# Patient Record
Sex: Female | Born: 1935 | Race: White | Hispanic: No | Smoking: Former smoker
Health system: Southern US, Community
[De-identification: ages and names within clinical notes are randomized; demographics above are authoritative.]

## PROBLEM LIST (undated history)

## (undated) DIAGNOSIS — K635 Polyp of colon: Secondary | ICD-10-CM

## (undated) DIAGNOSIS — Z87891 Personal history of nicotine dependence: Secondary | ICD-10-CM

## (undated) DIAGNOSIS — K219 Gastro-esophageal reflux disease without esophagitis: Secondary | ICD-10-CM

## (undated) DIAGNOSIS — J449 Chronic obstructive pulmonary disease, unspecified: Secondary | ICD-10-CM

## (undated) DIAGNOSIS — R0600 Dyspnea, unspecified: Secondary | ICD-10-CM

## (undated) DIAGNOSIS — E785 Hyperlipidemia, unspecified: Secondary | ICD-10-CM

## (undated) DIAGNOSIS — E0500 Thyrotoxicosis with diffuse goiter without thyrotoxic crisis or storm: Secondary | ICD-10-CM

## (undated) DIAGNOSIS — E079 Disorder of thyroid, unspecified: Secondary | ICD-10-CM

## (undated) DIAGNOSIS — R0609 Other forms of dyspnea: Secondary | ICD-10-CM

## (undated) HISTORY — DX: Disorder of thyroid, unspecified: E07.9

## (undated) HISTORY — DX: Gastro-esophageal reflux disease without esophagitis: K21.9

## (undated) HISTORY — DX: Other forms of dyspnea: R06.09

## (undated) HISTORY — DX: Chronic obstructive pulmonary disease, unspecified: J44.9

## (undated) HISTORY — DX: Polyp of colon: K63.5

## (undated) HISTORY — DX: Dyspnea, unspecified: R06.00

## (undated) HISTORY — DX: Hyperlipidemia, unspecified: E78.5

## (undated) HISTORY — DX: Personal history of nicotine dependence: Z87.891

## (undated) HISTORY — DX: Thyrotoxicosis with diffuse goiter without thyrotoxic crisis or storm: E05.00

---
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
## 1958-01-22 HISTORY — PX: CHOLECYSTECTOMY: SHX55

## 1958-01-22 HISTORY — PX: APPENDECTOMY: SHX54

## 1998-03-11 ENCOUNTER — Ambulatory Visit (HOSPITAL_COMMUNITY): Admission: RE | Admit: 1998-03-11 | Discharge: 1998-03-11 | Payer: Self-pay | Admitting: Gastroenterology

## 2000-01-23 HISTORY — PX: CATARACT EXTRACTION: SUR2

## 2000-05-01 ENCOUNTER — Other Ambulatory Visit: Admission: RE | Admit: 2000-05-01 | Discharge: 2000-05-01 | Payer: Self-pay

## 2001-04-16 ENCOUNTER — Encounter: Admission: RE | Admit: 2001-04-16 | Discharge: 2001-04-16 | Payer: Self-pay | Admitting: Obstetrics and Gynecology

## 2001-04-16 ENCOUNTER — Encounter: Payer: Self-pay | Admitting: Obstetrics and Gynecology

## 2001-10-27 ENCOUNTER — Other Ambulatory Visit: Admission: RE | Admit: 2001-10-27 | Discharge: 2001-10-27 | Payer: Self-pay | Admitting: Obstetrics and Gynecology

## 2002-09-09 ENCOUNTER — Encounter: Payer: Self-pay | Admitting: Obstetrics and Gynecology

## 2002-09-09 ENCOUNTER — Encounter: Admission: RE | Admit: 2002-09-09 | Discharge: 2002-09-09 | Payer: Self-pay | Admitting: Obstetrics and Gynecology

## 2002-11-10 ENCOUNTER — Other Ambulatory Visit: Admission: RE | Admit: 2002-11-10 | Discharge: 2002-11-10 | Payer: Self-pay | Admitting: Obstetrics and Gynecology

## 2004-02-01 ENCOUNTER — Encounter: Admission: RE | Admit: 2004-02-01 | Discharge: 2004-02-01 | Payer: Self-pay | Admitting: Obstetrics and Gynecology

## 2004-10-03 ENCOUNTER — Ambulatory Visit: Payer: Self-pay | Admitting: Family Medicine

## 2004-10-10 ENCOUNTER — Encounter: Admission: RE | Admit: 2004-10-10 | Discharge: 2004-10-10 | Payer: Self-pay | Admitting: Family Medicine

## 2004-10-10 ENCOUNTER — Ambulatory Visit: Payer: Self-pay

## 2005-01-30 ENCOUNTER — Encounter: Admission: RE | Admit: 2005-01-30 | Discharge: 2005-01-30 | Payer: Self-pay | Admitting: Family Medicine

## 2005-01-30 ENCOUNTER — Ambulatory Visit: Payer: Self-pay | Admitting: Family Medicine

## 2005-02-02 ENCOUNTER — Ambulatory Visit: Payer: Self-pay | Admitting: Family Medicine

## 2005-02-06 ENCOUNTER — Encounter: Admission: RE | Admit: 2005-02-06 | Discharge: 2005-02-06 | Payer: Self-pay | Admitting: Obstetrics and Gynecology

## 2005-02-06 ENCOUNTER — Other Ambulatory Visit: Admission: RE | Admit: 2005-02-06 | Discharge: 2005-02-06 | Payer: Self-pay | Admitting: Obstetrics and Gynecology

## 2005-03-19 ENCOUNTER — Encounter: Payer: Self-pay | Admitting: Internal Medicine

## 2005-11-06 ENCOUNTER — Ambulatory Visit: Payer: Self-pay | Admitting: Internal Medicine

## 2005-11-20 ENCOUNTER — Ambulatory Visit: Payer: Self-pay

## 2005-12-11 ENCOUNTER — Ambulatory Visit: Payer: Self-pay | Admitting: Internal Medicine

## 2005-12-25 ENCOUNTER — Encounter: Payer: Self-pay | Admitting: Internal Medicine

## 2005-12-25 LAB — CONVERTED CEMR LAB
ALT: 20 units/L (ref 0–40)
AST: 20 units/L (ref 0–37)
Chol/HDL Ratio, serum: 4.6
HDL: 42.6 mg/dL (ref >39.0)
Hgb A1c MFr Bld: 6.1 % — ABNORMAL HIGH (ref 4.6–6.0)
LDL Cholesterol: 123 mg/dL — ABNORMAL HIGH (ref 0–99)
Triglyceride fasting, serum: 148 mg/dL (ref 0–149)
VLDL: 30 mg/dL (ref 0–40)

## 2006-01-08 ENCOUNTER — Ambulatory Visit: Payer: Self-pay | Admitting: Internal Medicine

## 2006-03-19 ENCOUNTER — Ambulatory Visit: Payer: Self-pay | Admitting: Internal Medicine

## 2006-03-22 ENCOUNTER — Ambulatory Visit: Payer: Self-pay | Admitting: Internal Medicine

## 2006-03-22 LAB — CONVERTED CEMR LAB
HDL: 45.3 mg/dL (ref >39.0)
Hgb A1c MFr Bld: 6.1 % — ABNORMAL HIGH (ref 4.6–6.0)
Triglycerides: 79 mg/dL (ref 0–149)
VLDL: 16 mg/dL (ref 0–40)

## 2006-06-25 ENCOUNTER — Ambulatory Visit: Payer: Self-pay | Admitting: Internal Medicine

## 2006-06-25 LAB — CONVERTED CEMR LAB
BUN: 18 mg/dL (ref 6–23)
CO2: 31 meq/L (ref 19–32)
Calcium: 9.4 mg/dL (ref 8.4–10.5)
Creatinine, Ser: 1 mg/dL (ref 0.4–1.2)
GFR calc non Af Amer: 58 mL/min
Glucose, Bld: 97 mg/dL (ref 70–99)
Potassium: 4.1 meq/L (ref 3.5–5.1)
Sodium: 137 meq/L (ref 135–145)
Vit D, 1,25-Dihydroxy: 37 (ref 20–57)

## 2006-12-23 ENCOUNTER — Encounter: Payer: Self-pay | Admitting: Internal Medicine

## 2006-12-23 DIAGNOSIS — E785 Hyperlipidemia, unspecified: Secondary | ICD-10-CM

## 2006-12-23 DIAGNOSIS — E0500 Thyrotoxicosis with diffuse goiter without thyrotoxic crisis or storm: Secondary | ICD-10-CM | POA: Insufficient documentation

## 2006-12-23 DIAGNOSIS — D126 Benign neoplasm of colon, unspecified: Secondary | ICD-10-CM

## 2006-12-23 DIAGNOSIS — E039 Hypothyroidism, unspecified: Secondary | ICD-10-CM | POA: Insufficient documentation

## 2006-12-23 DIAGNOSIS — K219 Gastro-esophageal reflux disease without esophagitis: Secondary | ICD-10-CM

## 2006-12-24 ENCOUNTER — Ambulatory Visit: Payer: Self-pay | Admitting: Internal Medicine

## 2006-12-24 DIAGNOSIS — R7309 Other abnormal glucose: Secondary | ICD-10-CM | POA: Insufficient documentation

## 2006-12-24 DIAGNOSIS — M810 Age-related osteoporosis without current pathological fracture: Secondary | ICD-10-CM | POA: Insufficient documentation

## 2007-01-10 ENCOUNTER — Ambulatory Visit: Payer: Self-pay | Admitting: Internal Medicine

## 2007-01-10 LAB — CONVERTED CEMR LAB
Direct LDL: 121.1 mg/dL
HDL: 48.1 mg/dL (ref >39.0)
Hgb A1c MFr Bld: 6.1 % — ABNORMAL HIGH (ref 4.6–6.0)
TSH: 3.44 microintl units/mL (ref 0.35–5.50)
Triglycerides: 130 mg/dL (ref 0–149)

## 2007-02-25 ENCOUNTER — Ambulatory Visit: Payer: Self-pay | Admitting: Internal Medicine

## 2007-02-25 ENCOUNTER — Encounter: Payer: Self-pay | Admitting: Internal Medicine

## 2007-04-08 ENCOUNTER — Ambulatory Visit: Payer: Self-pay | Admitting: Internal Medicine

## 2007-04-08 DIAGNOSIS — M542 Cervicalgia: Secondary | ICD-10-CM

## 2007-05-13 ENCOUNTER — Ambulatory Visit: Payer: Self-pay | Admitting: Internal Medicine

## 2007-06-03 ENCOUNTER — Encounter: Admission: RE | Admit: 2007-06-03 | Discharge: 2007-06-03 | Payer: Self-pay | Admitting: Obstetrics and Gynecology

## 2007-06-05 LAB — CONVERTED CEMR LAB

## 2007-06-10 ENCOUNTER — Other Ambulatory Visit
Admission: RE | Admit: 2007-06-10 | Discharge: 2007-06-10 | Payer: Self-pay | Admitting: Physical Medicine & Rehabilitation

## 2007-06-24 ENCOUNTER — Ambulatory Visit: Payer: Self-pay | Admitting: Internal Medicine

## 2007-06-24 DIAGNOSIS — M25519 Pain in unspecified shoulder: Secondary | ICD-10-CM

## 2007-10-28 ENCOUNTER — Ambulatory Visit: Payer: Self-pay | Admitting: Internal Medicine

## 2007-12-23 ENCOUNTER — Ambulatory Visit: Payer: Self-pay | Admitting: Internal Medicine

## 2007-12-23 LAB — CONVERTED CEMR LAB
ALT: 21 units/L (ref 0–35)
AST: 23 units/L (ref 0–37)
CO2: 30 meq/L (ref 19–32)
Calcium: 9.5 mg/dL (ref 8.4–10.5)
Chloride: 108 meq/L (ref 96–112)
Cholesterol: 232 mg/dL (ref 0–200)
Creatinine, Ser: 1 mg/dL (ref 0.4–1.2)
Direct LDL: 152.1 mg/dL
GFR calc Af Amer: 70 mL/min
Hgb A1c MFr Bld: 6.1 % — ABNORMAL HIGH (ref 4.6–6.0)
Sodium: 144 meq/L (ref 135–145)
TSH: 10.9 microintl units/mL — ABNORMAL HIGH (ref 0.35–5.50)
Triglycerides: 138 mg/dL (ref 0–149)

## 2007-12-25 ENCOUNTER — Telehealth: Payer: Self-pay | Admitting: Internal Medicine

## 2007-12-25 ENCOUNTER — Encounter: Payer: Self-pay | Admitting: Internal Medicine

## 2008-02-03 ENCOUNTER — Ambulatory Visit: Payer: Self-pay | Admitting: Internal Medicine

## 2008-02-04 ENCOUNTER — Telehealth (INDEPENDENT_AMBULATORY_CARE_PROVIDER_SITE_OTHER): Payer: Self-pay | Admitting: *Deleted

## 2008-09-13 ENCOUNTER — Encounter: Payer: Self-pay | Admitting: Internal Medicine

## 2008-09-15 ENCOUNTER — Encounter: Admission: RE | Admit: 2008-09-15 | Discharge: 2008-09-15 | Payer: Self-pay | Admitting: Gastroenterology

## 2008-09-15 ENCOUNTER — Encounter: Payer: Self-pay | Admitting: Internal Medicine

## 2008-09-15 ENCOUNTER — Encounter: Admission: RE | Admit: 2008-09-15 | Discharge: 2008-09-15 | Payer: Self-pay | Admitting: Obstetrics and Gynecology

## 2008-09-20 ENCOUNTER — Encounter: Payer: Self-pay | Admitting: Internal Medicine

## 2008-09-21 LAB — HM COLONOSCOPY

## 2008-09-23 ENCOUNTER — Encounter: Payer: Self-pay | Admitting: Internal Medicine

## 2008-09-23 ENCOUNTER — Encounter: Admission: RE | Admit: 2008-09-23 | Discharge: 2008-09-23 | Payer: Self-pay | Admitting: Gastroenterology

## 2008-12-01 ENCOUNTER — Ambulatory Visit: Payer: Self-pay | Admitting: Internal Medicine

## 2008-12-30 ENCOUNTER — Ambulatory Visit: Payer: Self-pay | Admitting: Internal Medicine

## 2008-12-30 LAB — CONVERTED CEMR LAB
Albumin: 4.6 g/dL (ref 3.5–5.2)
Cholesterol: 190 mg/dL (ref 0–200)
HDL: 47 mg/dL (ref >39)
Indirect Bilirubin: 0.5 mg/dL (ref 0.0–0.9)
LDL Cholesterol: 117 mg/dL — ABNORMAL HIGH (ref 0–99)
TSH: 0.339 microintl units/mL — ABNORMAL LOW (ref 0.350–4.500)
Total CHOL/HDL Ratio: 4
Total Protein: 7.6 g/dL (ref 6.0–8.3)
Triglycerides: 128 mg/dL (ref <150)

## 2008-12-31 ENCOUNTER — Telehealth: Payer: Self-pay | Admitting: Internal Medicine

## 2009-01-11 ENCOUNTER — Encounter (INDEPENDENT_AMBULATORY_CARE_PROVIDER_SITE_OTHER): Payer: Self-pay | Admitting: *Deleted

## 2009-01-17 ENCOUNTER — Encounter: Payer: Self-pay | Admitting: Internal Medicine

## 2009-01-25 ENCOUNTER — Telehealth: Payer: Self-pay | Admitting: Internal Medicine

## 2009-03-09 ENCOUNTER — Encounter: Payer: Self-pay | Admitting: Internal Medicine

## 2009-03-15 ENCOUNTER — Ambulatory Visit: Payer: Self-pay | Admitting: Internal Medicine

## 2009-03-15 LAB — CONVERTED CEMR LAB
Calcium: 9.4 mg/dL (ref 8.4–10.5)
Phosphorus: 4.2 mg/dL (ref 2.3–4.6)
TSH: 1.427 microintl units/mL (ref 0.350–4.500)

## 2009-03-29 ENCOUNTER — Ambulatory Visit (HOSPITAL_COMMUNITY): Admission: RE | Admit: 2009-03-29 | Discharge: 2009-03-29 | Payer: Self-pay | Admitting: Internal Medicine

## 2009-03-29 ENCOUNTER — Encounter: Payer: Self-pay | Admitting: Internal Medicine

## 2009-04-04 ENCOUNTER — Ambulatory Visit: Payer: Self-pay | Admitting: Internal Medicine

## 2009-04-04 DIAGNOSIS — H9209 Otalgia, unspecified ear: Secondary | ICD-10-CM | POA: Insufficient documentation

## 2009-05-05 ENCOUNTER — Ambulatory Visit: Payer: Self-pay | Admitting: Family Medicine

## 2009-05-05 ENCOUNTER — Encounter: Payer: Self-pay | Admitting: Internal Medicine

## 2009-07-07 ENCOUNTER — Encounter: Payer: Self-pay | Admitting: Internal Medicine

## 2009-07-12 ENCOUNTER — Ambulatory Visit: Payer: Self-pay | Admitting: Internal Medicine

## 2009-08-09 ENCOUNTER — Encounter: Payer: Self-pay | Admitting: Internal Medicine

## 2009-08-30 ENCOUNTER — Encounter: Payer: Self-pay | Admitting: Internal Medicine

## 2009-11-02 ENCOUNTER — Telehealth (INDEPENDENT_AMBULATORY_CARE_PROVIDER_SITE_OTHER): Payer: Self-pay | Admitting: *Deleted

## 2009-11-10 ENCOUNTER — Encounter: Payer: Self-pay | Admitting: Pulmonary Disease

## 2009-11-10 ENCOUNTER — Ambulatory Visit: Payer: Self-pay | Admitting: Pulmonary Disease

## 2009-11-10 DIAGNOSIS — J4489 Other specified chronic obstructive pulmonary disease: Secondary | ICD-10-CM | POA: Insufficient documentation

## 2009-11-10 DIAGNOSIS — J449 Chronic obstructive pulmonary disease, unspecified: Secondary | ICD-10-CM | POA: Insufficient documentation

## 2009-11-15 ENCOUNTER — Telehealth: Payer: Self-pay | Admitting: Pulmonary Disease

## 2009-11-17 ENCOUNTER — Ambulatory Visit: Payer: Self-pay | Admitting: Internal Medicine

## 2009-12-21 ENCOUNTER — Encounter: Payer: Self-pay | Admitting: Internal Medicine

## 2009-12-21 ENCOUNTER — Ambulatory Visit: Payer: Self-pay | Admitting: Diagnostic Radiology

## 2009-12-21 ENCOUNTER — Ambulatory Visit (HOSPITAL_BASED_OUTPATIENT_CLINIC_OR_DEPARTMENT_OTHER)
Admission: RE | Admit: 2009-12-21 | Discharge: 2009-12-21 | Payer: Self-pay | Source: Home / Self Care | Admitting: Internal Medicine

## 2009-12-21 LAB — CONVERTED CEMR LAB
ALT: 16 units/L (ref 0–35)
Alkaline Phosphatase: 64 units/L (ref 39–117)
BUN: 20 mg/dL (ref 6–23)
Chloride: 97 meq/L (ref 96–112)
Creatinine, Ser: 0.89 mg/dL (ref 0.40–1.20)
HDL: 55 mg/dL (ref >39)
Hgb A1c MFr Bld: 5.8 % — ABNORMAL HIGH (ref <5.7)
Indirect Bilirubin: 0.4 mg/dL (ref 0.0–0.9)
Potassium: 4.4 meq/L (ref 3.5–5.3)
Sodium: 141 meq/L (ref 135–145)
TSH: 0.535 microintl units/mL (ref 0.350–4.500)
Total CHOL/HDL Ratio: 3.7
Triglycerides: 107 mg/dL (ref <150)
VLDL: 21 mg/dL (ref 0–40)

## 2009-12-21 LAB — HM MAMMOGRAPHY: HM Mammogram: NORMAL

## 2009-12-29 ENCOUNTER — Ambulatory Visit: Payer: Self-pay | Admitting: Internal Medicine

## 2010-01-03 ENCOUNTER — Ambulatory Visit: Payer: Self-pay | Admitting: Pulmonary Disease

## 2010-02-21 NOTE — Miscellaneous (Signed)
Summary: BONE DENSITY  Clinical Lists Changes  Orders: Added new Test order of T-Bone Densitometry (77080) - Signed Added new Test order of T-Lumbar Vertebral Assessment (77082) - Signed 

## 2010-02-21 NOTE — Assessment & Plan Note (Signed)
Summary: new pt//sob/ok per sn//td   Primary Care Provider:  Dondra Spry DO  CC:  New patient self referred for DOE....  History of Present Illness: 77 y/o WF, who used to work for [employer] in the 90's, self referred for complaints of DOE... she is an ex-smoker, no prev COPD diagnosis, no hx recurrent infections, etc... she has hx Hyperlipidemia, hx Grave's dis- s/p I-131 Rx w/ subseq hypothyroidism on Synthroid, hx GERD, hx colon polyps w/ FamHx of Lynch syndrome, and Osteoporosis (SEE BELOW).   ~  November 10, 2009:  presents w/ CC of DOE x 65yr- noted w/ yard work, housework, etc... gradual onset & not really progressive by her hx but appt request precipitated by episode of SOB several weeks ago while in her car... she notes SOB= hard to get the air out, denies any cough or phlegm, no hemoptysis, no wheezing heard, no CP or palpit, no change in swelling etc... she has not had any recent infection... she is an ex-smoker having started in her 76's & smoked up to 2ppd for 40 yrs, quitting in 2003 at age 29 (she quit then because of SOB)... her last CXR was 9/06 and showed tort Ao, clear lungs, NAD... she has not had prev PFTs.   Current Problems:   CHRONIC OBSTRUCTIVE PULMONARY DISEASE (ICD-496), DYSPNEA ON EXERTION (ICD-786.09), Hx of TOBACCO ABUSE (ICD-305.1)  ** SEE ABOVE ** started on ADVAIR 250- 2spBid & SPIRIVA daily.  ~  ex-smoker> started in her 75's & smoked up to 2ppd for 40 yrs, quitting in 2003 at age 20.  ~  CXR 10/11 showed COPD, tortuous Ao, NAD...  ~  PFTs 10/11 showed FVC= 1.70 (51%), FEV1= 1.10 (44%), %1sec= 65, mid-flows= 32%pred.  HYPERLIPIDEMIA (ICD-272.4) - on LIPITOR 20mg /d...  GRAVE'S DISEASE (ICD-242.00) - treated w/ I-131 in 2004...  HYPOTHYROIDISM (ICD-244.9) - on SYNTHROID 176mcg/d...  GASTROESOPHAGEAL REFLUX DISEASE (ICD-530.81) - on ZANTAC 150mg  Bid Prn for reflux symptoms.  ~  EGD by [facility] 8/10 showed a Schatzki ring, mod HH, otherw neg...  COLONIC POLYPS  (ICD-211.3) - followed by [facility]> she had hx of ischemic colitis in past...  ~  colonoscopy 8/10 showed scattered divertics, 1 diminutive polyp= hyperplastic.  ~  MRI Abd 9/10 showed mild biliary dil- related to prev cholecystectomy, pancreas divisum, otherw neg.  OSTEOPOROSIS (ICD-733.00) - on RECLAST infusions yearly...  R/O GENETIC SUSCEPTIBILITY OTHER MALIGNANT NEOPLASM (ICD-V84.09) - there is a family hx of [facility] Syndrome & she has been evaluated at the [facility]> she was negative for the MLH1 mutation previously identified in members of her family...   Preventive Screening-Counseling & Management  Alcohol-Tobacco     Smoking Status: quit     Year Quit: 2003  Comments: smoked 1-2 ppd  Allergies: 1)  ! Codeine 2)  ! Sulfa 3)  ! Niacin 4)  ! * Bee Sting  Comments:  Nurse/Medical Assistant: The patient's medications and allergies were reviewed with the patient and were updated in the Medication and Allergy Lists.  Past History:  Past Medical History: DYSPNEA ON EXERTION (ICD-786.09) CHRONIC OBSTRUCTIVE PULMONARY DISEASE (ICD-496) Hx of TOBACCO ABUSE (ICD-305.1) HYPERLIPIDEMIA (ICD-272.4) GRAVE'S DISEASE (ICD-242.00) HYPOTHYROIDISM (ICD-244.9) GASTROESOPHAGEAL REFLUX DISEASE (ICD-530.81) COLONIC POLYPS (ICD-211.3) R/O GENETIC SUSCEPTIBILITY OTHER MALIGNANT NEOPLASM (ICD-V84.09) OSTEOPOROSIS (ICD-733.00)  Past Surgical History: Appendectomy 1960 Cholecystectomy 1960 Tonsillectomy 1941 Cataract extraction 2002  Family History: Reviewed history from 07/12/2009 and no changes required. Mother deceased at age 91 secondary to complications of septic shock.  Her mother was noted to have rheumatoid arthritis.  Father deceased at age 76, presumed secondary to old age, but complications of hip fracture.  The patient has a brother with known coronary artery disease status post stents.  His coronary artery disease was first discovered at age 36,  and the patient had multiple siblings with colon cancer.  Social History: Reviewed history from 07/12/2009 and no changes required. 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  She is originally from [place].  Previous to living in [place], she was in [place], [place].   Alcohol use-yes (rare)   Former Smoker   Review of Systems       The patient complains of dyspnea on exertion.  The patient denies anorexia, fever, weight loss, weight gain, vision loss, decreased hearing, hoarseness, chest pain, syncope, peripheral edema, prolonged cough, headaches, hemoptysis, abdominal pain, melena, hematochezia, severe indigestion/heartburn, hematuria, incontinence, muscle weakness, suspicious skin lesions, transient blindness, difficulty walking, depression, unusual weight change, abnormal bleeding, enlarged lymph nodes, and angioedema.   Resp:  Complains of shortness of breath; denies chest discomfort, chest pain with inspiration, cough, coughing up blood, excessive snoring, hypersomnolence, morning headaches, pleuritic, sputum productive, and wheezing.  Vital Signs:  Patient profile:   77 year old female Height:      68 inches Weight:      174.25 pounds BMI:     26.59 O2 Sat:      96 % on Room air Temp:     98.5 degrees F oral Pulse rate:   68 / minute BP sitting:   150 / 80  (left arm) Cuff size:   regular  Vitals Entered By: Randell Loop CMA (November 10, 2009 2:10 PM)  O2 Sat at Rest %:  96 O2 Flow:  Room air CC: New patient self referred for DOE... Is Patient Diabetic? No Pain Assessment Patient in pain? no      Comments meds updated today with pt   Physical Exam  Additional Exam:  WD, WN, 77 y/o WF in NAD... GENERAL:  Alert & oriented; pleasant & cooperative... HEENT:  [place]/AT, EOM-wnl, PERRLA, EACs-clear, TMs-wnl, NOSE-clear, THROAT-clear & wnl. NECK:  Supple w/ fairROM; no JVD; normal carotid impulses w/o bruits; no  thyromegaly or nodules palpated; no lymphadenopathy. CHEST:  Clear to P & A w/ decr BS bilat- without wheezes/ rales/ or rhonchi heard... HEART:  Regular Rhythm; without murmurs/ rubs/ or gallops detected... ABDOMEN:  Soft & nontender; normal bowel sounds; no organomegaly or masses palpated... EXT: without deformities or arthritic changes; no varicose veins/ venous insuffic/ or edema. NEURO:  CN's intact; motor testing normal; sensory testing normal; gait normal & balance OK. DERM:  No lesions noted; no rash etc...    CXR  Procedure date:  11/10/2009  Findings:      CHEST - 2 VIEW Comparison: Chest 10/10/2004.   Findings: The chest appears somewhat hyperexpanded with attenuation of the pulmonary vasculature.  Lungs are clear.  No effusion.  Heart size normal.   IMPRESSION: Findings compatible with COPD.  No acute abnormality.   Read By:  Charyl Dancer,  M.D.   Pulmonary Function Test Date: 11/10/2009 Height (in.): 68 Gender: Female  Pre-Spirometry FVC    Value: 1.70 L/min   Pred: 3.32 L/min     % Pred: 51 % FEV1    Value: 1.10 L     Pred: 2.50 L     % Pred: 44 %  FEV1/FVC  Value: 65 %     Pred: 75 %     % Pred: -- % FEF 25-75  Value: 0.61 L/min   Pred: 1.90 L/min     % Pred: 32 %  Comments: PFT c/w mod-severe airflow obstruction... SN  Impression & Recommendations:  Problem # 1:  CHRONIC OBSTRUCTIVE PULMONARY DISEASE (ICD-496) Pat has signif COPD from her past smoking... we reviewed the pathophysiology of COPD both ChrBronchitis & Emphysema (she appears to be skewed toward the Emphysema side of the equation)... she no longer smokes, and we reviewed several aspects of treatment:  ADVAIR, SPIRIVA, & exercise program... inhalers were demonstrated & technique reviewed w/ pt. Her updated medication list for this problem includes:    Advair Diskus 250-50 Mcg/dose Aepb (Fluticasone-salmeterol) .Marland Kitchen... 1 inhalation two times a day    Spiriva Handihaler 18 Mcg Caps (Tiotropium  bromide monohydrate) ..... Inhale contents on one capsule via handihaler daily...  Problem # 2:  DYSPNEA ON EXERTION (ICD-786.09) We discussed the importance of an exercise program... Her updated medication list for this problem includes:    Advair Diskus 250-50 Mcg/dose Aepb (Fluticasone-salmeterol) .Marland Kitchen... 1 inhalation two times a day    Spiriva Handihaler 18 Mcg Caps (Tiotropium bromide monohydrate) ..... Inhale contents on one capsule via handihaler daily...  Problem # 3:  HYPOTHYROIDISM (ICD-244.9) She has hx Graves dis & prev I-131 Rx... then Synthroid replacement & seems well replaced on 161mcg/d... she thought that her thyroid prob had something to do w/ her DOE & we discussed this & I reassured her that she is well regulated & stable (lab flow chart reviewed w/ pt)... Her updated medication list for this problem includes:    Levothyroxine Sodium 100 Mcg Tabs (Levothyroxine sodium) ..... One by mouth once daily  Problem # 4:  R/O GENETIC SUSCEPTIBILITY OTHER MALIGNANT NEOPLASM (ICD-V84.09) Family Hx of Lynch Syndrome but she tested neg for the MLH1 mutation from the [facility]...  Problem # 5:  OTHER MEDICAL PROBLEMS AS NOTED>>>  Complete Medication List: 1)  Advair Diskus 250-50 Mcg/dose Aepb (Fluticasone-salmeterol) .Marland Kitchen.. 1 inhalation two times a day 2)  Spiriva Handihaler 18 Mcg Caps (Tiotropium bromide monohydrate) .... Inhale contents on one capsule via handihaler daily.Marland KitchenMarland Kitchen 3)  Aspirin 81 Mg Tabs (Aspirin) .... Take 1 tablet by mouth once a day 4)  Lipitor 20 Mg Tabs (Atorvastatin calcium) .... One by mouth once daily 5)  Levothyroxine Sodium 100 Mcg Tabs (Levothyroxine sodium) .... One by mouth once daily 6)  Ranitidine Hcl 150 Mg Tabs (Ranitidine hcl) .... One by mouth two times a day prn 7)  Vitamin D 1000 Unit Tabs (Cholecalciferol) .... 2 tablets by mouth once daily  Other Orders: Spirometry w/Graph (94010) T-2 View CXR (71020TC)  Patient Instructions: 1)   Today we updated your med list- see below.... 2)  Your CXR & breathing tests reveal significant COPD & we have embarked on a treatment regimen including ADVAIR & SPIRIVA as we discussed... take them regularly & avoid infections... 3)  We discussed a regular exercise program for you.Marland KitchenMarland Kitchen 4)  Call for any questions.Marland KitchenMarland Kitchen 5)  Let's plan a brief follow up visit early in Dec before you leave for [place]... Prescriptions: SPIRIVA HANDIHALER 18 MCG CAPS (TIOTROPIUM BROMIDE MONOHYDRATE) inhale contents on one capsule via handihaler daily...  #30 x 12   Entered and Authorized by:   Michele Mcalpine MD   Signed by:   Michele Mcalpine MD on 11/10/2009   Method used:  Print then Give to Patient   RxID:   [id number] ADVAIR DISKUS 250-50 MCG/DOSE AEPB (FLUTICASONE-SALMETEROL) 1 inhalation two times a day  #1 x 12   Entered and Authorized by:   Michele Mcalpine MD   Signed by:   Michele Mcalpine MD on 11/10/2009   Method used:   Print then Give to Patient   RxID:   [id number]

## 2010-02-21 NOTE — Assessment & Plan Note (Signed)
Summary: SHINGLES VAC/AWARE OF FEE/NML  Nurse Visit    Prior Medications: LEVOTHYROXINE SODIUM 88 MCG  TABS (LEVOTHYROXINE SODIUM) Take 1 tablet by mouth once a day BONIVA 150 MG  TABS (IBANDRONATE SODIUM) one tablet by mouth once monthly LIPITOR 40 MG  TABS (ATORVASTATIN CALCIUM) Take 1 tablet by mouth once a day Current Allergies: ! CODEINE ! SULFA ! NIACIN ! * BEE STING   Zostavax # 1    Vaccine Type: Zostavax    Site: left deltoid    Mfr: Merck    Dose: 0.65    Route: [place]    Given by: Rock Nephew CMA    Exp. Date: 08/27/2008    Lot #: [id number]    VIS given: 11/03/04 given May 13, 2007.   Orders Added: 1)  Zoster (Shingles) Vaccine Live [90736] 2)  Admin 1st Vaccine [email or web address]    ]

## 2010-02-21 NOTE — Letter (Signed)
Summary: [facility]   [facility]   Imported By: Lanelle Bal 09/14/2009 09:51:14  _____________________________________________________________________  External Attachment:    Type:   Image     Comment:   External Document

## 2010-02-21 NOTE — Miscellaneous (Signed)
Summary: Lab Orders  Clinical Lists Changes  Orders: Added new Test order of T-TSH [phone]) - Signed

## 2010-02-21 NOTE — Consult Note (Signed)
Summary: [facility]  [facility]   Imported By: Lanelle Bal 08/19/2009 13:48:46  _____________________________________________________________________  External Attachment:    Type:   Image     Comment:   External Document

## 2010-02-21 NOTE — Assessment & Plan Note (Signed)
Summary: flu shot/mhf  Nurse Visit   Vital Signs:  Patient profile:   77 year old female Temp:     97.6 degrees F oral  Vitals Entered By: Mervin Kung CMA Duncan Dull) (November 17, 2009 2:10 PM)  Allergies: 1)  ! Codeine 2)  ! Sulfa 3)  ! Niacin 4)  ! * Bee Sting  Orders Added: 1)  Flu Vaccine 73yrs + MEDICARE PATIENTS [Q2039] 2)  Administration Flu vaccine - MCR [G0008]   Flu Vaccine Consent Questions     Do you have a history of severe allergic reactions to this vaccine? no    Any prior history of allergic reactions to egg and/or gelatin? no    Do you have a sensitivity to the preservative Thimersol? no    Do you have a past history of Guillan-Barre Syndrome? no    Do you currently have an acute febrile illness? no    Have you ever had a severe reaction to latex? no    Vaccine information given and explained to patient? yes    Are you currently pregnant? no    Lot Number:[id number]   Exp Date:07/22/2010   Site Given  Left Deltoid IM.  Nicki Guadalajara Fergerson CMA Duncan Dull)  November 17, 2009 2:16 PM

## 2010-02-21 NOTE — Miscellaneous (Signed)
Summary: Lab Work  Clinical Lists Changes  Orders: Added new Test order of T-Creatine [phone]) - Signed Added new Test order of T-Phosphorus [phone]) - Signed Added new Test order of T-Magnesium [phone]) - Signed Added new Test order of T-Calcium  ([id number]) - Signed Added new Test order of T-TSH ([id number]) - Signed

## 2010-02-21 NOTE — Assessment & Plan Note (Signed)
Summary: ear ache- jr   Vital Signs:  Patient profile:   77 year old female Weight:      168.50 pounds BMI:     25.71 O2 Sat:      96 % on Room air Temp:     97.6 degrees F oral Pulse rate:   71 / minute Pulse rhythm:   regular Resp:     20 per minute BP sitting:   110 / 70  (left arm) Cuff size:   regular  Vitals Entered By: Glendell Docker CMA (April 04, 2009 2:53 PM)  O2 Flow:  Room air CC: Rm 2- Right ear ache Comments c/o sudden onset Saturday of ear pain that woke her up from her that has worsened over the past few days. She has tried over the counter earache relief and taking Tylenol for pain with little relief. She denies dizziness or being off balance   Primary Care Provider:  DThomos Lemons DO  CC:  Rm 2- Right ear ache.  History of Present Illness: right ear ache.  onset - Saturday night sharp aching pain hurts with chewing no ear drainage no change in hearing  received ReClast infusion last week.  experienced flu like symptoms x 2 days.  Allergies: 1)  ! Codeine 2)  ! Sulfa 3)  ! Niacin 4)  ! * Bee Sting  Past History:  Past Medical History: Hyperlipidemia. History of gastroesophageal reflux disease. History of Graves' disease, status post radioactive ion therapy in February of 2004. Iatrogenic hypothyroidism.  History of colon polyps.  History of tobacco abuse.  Family history Lynch syndrome  Family History: Mother deceased at age 47 secondary to complications of septic shock.  Her mother was noted to have rheumatoid arthritis.  Father deceased at age 76, presumed secondary to old age, but complications of hip fracture.  The patient has a brother with known coronary artery disease status post stents.  His coronary artery disease was first discovered at age 69, and the patient had multiple siblings with colon cancer.        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   She is originally from [place].  Previous to living in [place], she was in [place], [place].   Alcohol use-yes (rare)  Former Smoker   Physical Exam  Ears:  R ear normal and L ear normal.     Impression & Recommendations:  Problem # 1:  EAR PAIN, RIGHT (ICD-388.70) Right ear appears normal.  I suspect pain from right TMJ.   Unclear if symptoms related to ReClast infusion last week.   She wears dentures.  no hx of dental implants.  use NSAIDs and voltaren gel as directed.  Patient advised to call office if symptoms persist or worsen.  Complete Medication List: 1)  Levothyroxine Sodium 100 Mcg Tabs (Levothyroxine sodium) .... One by mouth once daily 2)  Lipitor 40 Mg Tabs (Atorvastatin calcium) .... Take 1 tablet by mouth once a day 3)  Vitamin D 1000 Unit Tabs (Cholecalciferol) .... 2 tablets by mouth once daily 4)  Ranitidine Hcl 150 Mg Tabs (Ranitidine hcl) .... One by mouth two times a day prn 5)  Voltaren 1 % Gel (Diclofenac sodium) .... Apply three times a day  Patient Instructions: 1)  Use ibuprofen 200-400 mg two times a day as needed x 3-5 days. 2)  Use voltaren gel as directed.   3)  Call our  office if your symptoms do not  improve or gets worse.  Current Allergies (reviewed today): ! CODEINE ! SULFA ! NIACIN ! * BEE STING

## 2010-02-21 NOTE — Progress Notes (Signed)
Summary: Levothyroxine Refill  Phone Note Refill Request Message from:  Fax from Pharmacy on January 25, 2009 8:29 AM  Refills Requested: Medication #1:  LEVOTHYROXINE SODIUM 100 MCG TABS one by mouth once daily   Dosage confirmed as above?Dosage Confirmed   Brand Name Necessary? No   Supply Requested: 1 month   Last Refilled: 12/27/2008  Method Requested: Electronic Next Appointment Scheduled: 07-07-2009 8AM LAB  Initial call taken by: Roselle Locus,  January 25, 2009 8:30 AM    Prescriptions: LEVOTHYROXINE SODIUM 100 MCG TABS (LEVOTHYROXINE SODIUM) one by mouth once daily  #30 x 2   Entered by:   Glendell Docker CMA   Authorized by:   D. Thomos Lemons DO   Signed by:   Glendell Docker CMA on 01/25/2009   Method used:   Electronically to        [facility]  [address] [phone]* (retail)       [address]       [place], [place]  [address]       Ph: [phone] or [phone]       Fax: [phone]   RxID:   [id number]

## 2010-02-21 NOTE — Progress Notes (Signed)
Summary: written rx  Phone Note Call from Patient Call back at Home Phone [phone] Call back at [phone]   Caller: Patient Call For: nadel Reason for Call: Talk to Nurse Summary of Call: Patient needs written rx for advair and spiriva , 90 day supply.  Patient will pick up rx call when ready. Initial call taken by: Lehman Prom,  November 15, 2009 2:31 PM  Follow-up for Phone Call        I called this pt and tried to find out what is needed, her phone was breaking up the entire time, will try this again later. Vernie Murders  November 15, 2009 2:56 PM   Additional Follow-up for Phone Call Additional follow up Details #1::        Spoke with pt.  She states that she needs 90 days supply of spiriva and advair so that she can get meds for free through the [place].  She would like to come to office and pick up rxs once they are signed. Will place in SN lookat.  Additional Follow-up by: Vernie Murders,  November 15, 2009 3:33 PM    Additional Follow-up for Phone Call Additional follow up Details #2::    called and spoke with pt and she is aware of rx up front and ready to be picked up---[facility]---left 2 samples up front for her to pick these up as well.   Randell Loop CMA  November 15, 2009 5:18 PM   Prescriptions: SPIRIVA HANDIHALER 18 MCG CAPS (TIOTROPIUM BROMIDE MONOHYDRATE) inhale contents on one capsule via handihaler daily...  #90 x 3   Entered by:   Vernie Murders   Authorized by:   Michele Mcalpine MD   Signed by:   Vernie Murders on 11/15/2009   Method used:   Print then Give to Patient   RxID:   [id number] ADVAIR DISKUS 250-50 MCG/DOSE AEPB (FLUTICASONE-SALMETEROL) 1 inhalation two times a day  #3 x 3   Entered by:   Vernie Murders   Authorized by:   Michele Mcalpine MD   Signed by:   Vernie Murders on 11/15/2009   Method used:   Print then Give to Patient   RxID:   [id number]

## 2010-02-21 NOTE — Assessment & Plan Note (Signed)
Summary: 6 month follow up/mhf   Vital Signs:  Patient profile:   77 year old female Weight:      169.75 pounds BMI:     25.90 O2 Sat:      96 % on Room air Temp:     97.7 degrees F oral Pulse rate:   72 / minute Pulse rhythm:   regular Resp:     18 per minute BP sitting:   122 / 80  (left arm) Cuff size:   regular  Vitals Entered By: Glendell Docker CMA (July 12, 2009 11:03 AM)  O2 Flow:  Room air  Contraindications/Deferment of Procedures/Staging:    Test/Procedure: PAP Smear    Reason for deferment: patient declined  CC: Rm 2- 6 Month Follow up Is Patient Diabetic? No Pain Assessment Patient in pain? no      Comments discuss reclast and TMJ, need refill on Thyroid medication, medications reviewed no changes     Last PAP Result Declined   Primary Care Provider:  DThomos Lemons DO  CC:  Rm 2- 6 Month Follow up.  History of Present Illness: 77 y/o white female with hx of hypothyroidism, hyperlipidemia and osteoporosis for f/u still getting occ ear pain no hx of dental issues  came back from visiting family  (she has family in [place] and [place])  family member diagnosed with Lynch syndrome she is interested in genetic testing  Allergies: 1)  ! Codeine 2)  ! Sulfa 3)  ! Niacin 4)  ! * Bee Sting  Past History:  Past Medical History: Hyperlipidemia. History of gastroesophageal reflux disease. History of Graves' disease, status post radioactive ion therapy in February of 2004. Iatrogenic hypothyroidism.  History of colon polyps.   History of tobacco abuse.   Family history Lynch syndrome   Past Surgical History: Appendectomy 1960 Cholecystectomy 1960 Tonsillectomy 1941 Cataract extraction 2002       Family History: Mother deceased at age 68 secondary to complications of septic shock.  Her mother was noted to have rheumatoid arthritis.  Father deceased at age 6, presumed secondary to old age, but complications of hip fracture.  The patient has a  brother with known coronary artery disease status post stents.  His coronary artery disease was first discovered at age 33, and the patient had multiple siblings with colon cancer.         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  She is originally from [place].  Previous to living in [place], she was in [place], [place].   Alcohol use-yes (rare)   Former Smoker   Physical Exam  General:  alert, well-developed, and well-nourished.   Lungs:  normal respiratory effort and normal breath sounds.   Heart:  normal rate, regular rhythm, and no gallop.   Extremities:  No lower extremity edema  Psych:  normally interactive, good eye contact, not anxious appearing, and not depressed appearing.     Impression & Recommendations:  Problem # 1:  EAR PAIN, RIGHT (ICD-388.70) occ right ear pain.   pt worried TMJ related to ReClast.  reassurred pt .    Problem # 2:  HYPERLIPIDEMIA (ICD-272.4) stable.  Maintain current medication regimen.  Her updated medication list for this problem includes:    Lipitor 20 Mg Tabs (Atorvastatin calcium) ..... One by mouth once daily  Labs Reviewed: SGOT: 19 (12/30/2008)   SGPT: 18 (12/30/2008)   HDL:47 (12/30/2008), 53.2 (12/23/2007)  LDL:117 (12/30/2008), DEL (12/23/2007)  Chol:190 (12/30/2008), 232 (12/23/2007)  Trig:128 (12/30/2008), 138 (12/23/2007)  Problem # 3:  HYPOTHYROIDISM (ICD-244.9) Maintain current dose  Her updated medication list for this problem includes:    Levothyroxine Sodium 100 Mcg Tabs (Levothyroxine sodium) ..... One by mouth once daily  Labs Reviewed: TSH: 1.900 (07/07/2009)    HgBA1c: 6.1 (12/23/2007) Chol: 190 (12/30/2008)   HDL: 47 (12/30/2008)   LDL: 117 (12/30/2008)   TG: 128 (12/30/2008)  Problem # 4:  GENETIC SUSCEPTIBILITY OTHER MALIGNANT NEOPLASM (ICD-V84.09) family hx of lynch syndrome.   she is interested in genetic testing but local  testing is cost prohibitive. she will investigate testing in [place] - [facility]  last colonoscopy with in 2010 with Dr. Kinnie Scales.  diminuitive polyp biopsied.  next colonoscopy recommended in 5 yrs  Complete Medication List: 1)  Levothyroxine Sodium 100 Mcg Tabs (Levothyroxine sodium) .... One by mouth once daily 2)  Lipitor 20 Mg Tabs (Atorvastatin calcium) .... One by mouth once daily 3)  Vitamin D 1000 Unit Tabs (Cholecalciferol) .... 2 tablets by mouth once daily 4)  Ranitidine Hcl 150 Mg Tabs (Ranitidine hcl) .... One by mouth two times a day prn 5)  Voltaren 1 % Gel (Diclofenac sodium) .... Apply three times a day  Patient Instructions: 1)  Please schedule a follow-up appointment in 6 months. 2)  BMP prior to visit, ICD-9: 272.4 3)  Hepatic Panel prior to visit, ICD-9: 272.4 4)  Lipid Panel prior to visit, ICD-9: 272.4 5)  TSH prior to visit, ICD-9: 244.9 6)  HbgA1C prior to visit, ICD-9: 790.29 7)  Please return for lab work one (1) week before your next appointment.  Prescriptions: LIPITOR 20 MG TABS (ATORVASTATIN CALCIUM) one by mouth once daily  #90 x 1   Entered and Authorized by:   D. Thomos Lemons DO   Signed by:   D. Thomos Lemons DO on 07/12/2009   Method used:   Print then Give to Patient   RxID:   [id number] LIPITOR 20 MG TABS (ATORVASTATIN CALCIUM) one by mouth once daily  #90 x 1   Entered and Authorized by:   D. Thomos Lemons DO   Signed by:   D. Thomos Lemons DO on 07/12/2009   Method used:   [facility] to        [facility]  [address] [phone]* (retail)       [address]       [place], [place]  [address]       Ph: [phone] or [phone]       Fax: [phone]   RxID:   [id number] LEVOTHYROXINE SODIUM 100 MCG TABS (LEVOTHYROXINE SODIUM) one by mouth once daily  #90 x 1   Entered and Authorized by:   D. Thomos Lemons DO   Signed by:   D. Thomos Lemons DO on 07/12/2009   Method used:   [facility]        [facility]   [address] [phone]* (retail)       [address]       [place], [place]  [address]       Ph: [phone] or [phone]       Fax: [phone]   RxID:   [id number]   Current Allergies (reviewed today): ! CODEINE ! SULFA ! NIACIN ! * BEE STING   Preventive Care Screening  Pap Smear:  Date:  07/12/2009    Results:  Declined

## 2010-02-21 NOTE — Progress Notes (Signed)
Summary: req to see nadel/ new consult/ pulm  Phone Note Call from Patient   Caller: Patient Call For: nadel Summary of Call: pt wants to see dr Kriste Basque for pulm- SOB. says tammy davis knows her (pt used to work here) and someone told her that dr Kriste Basque would work her in. call cell [phone] Initial call taken by: Tivis Ringer, CNA,  November 02, 2009 2:38 PM  Follow-up for Phone Call        ok to schedule per sn, pt given appt for 10/20 at 2pm Follow-up by: Philipp Deputy CMA,  November 04, 2009 10:59 AM

## 2010-02-21 NOTE — Op Note (Signed)
Summary: Infusion Note/[facility]  Infusion Note/[facility]   Imported By: Lanelle Bal 04/06/2009 12:46:10  _____________________________________________________________________  External Attachment:    Type:   Image     Comment:   External Document

## 2010-02-21 NOTE — Miscellaneous (Signed)
Summary: lab orders  Clinical Lists Changes  Orders: Added new Test order of T-Basic Metabolic Panel ([id number]) - Signed Added new Test order of T-Hepatic Function (80076-22960) - Signed Added new Test order of T-Lipid Profile (80061-22930) - Signed Added new Test order of T-TSH (84443-23280) - Signed Added new Test order of T- Hemoglobin A1C (83036-23375) - Signed 

## 2010-02-23 NOTE — Assessment & Plan Note (Signed)
Summary: 6 MONTH FU/DT rsch per pt/dt   Vital Signs:  Patient profile:   77 year old female Height:      68 inches Weight:      171 pounds BMI:     26.09 O2 Sat:      95 % on Room air Temp:     98.4 degrees F oral Pulse rate:   75 / minute Resp:     16 per minute BP sitting:   140 / 80  (right arm) Cuff size:   regular  Vitals Entered By: Glendell Docker CMA (December 29, 2009 10:56 AM)  O2 Flow:  Room air CC: 6 month Follow up Is Patient Diabetic? No Pain Assessment Patient in pain? no      Comments printed rx for Lipitor[facility]- Ran[facility], no concerns, diagnosed with COPD in October, and under the care of Dr Kriste Basque   Primary Care Provider:  Dondra Spry DO  CC:  6 month Follow up.  History of Present Illness: 77 y/o white female for f/u travels freq to visit family  diagnosed with copd - seen by Dr. Kriste Basque dyspnea iimproved with use of inhalers   Preventive Screening-Counseling & Management  Alcohol-Tobacco     Smoking Status: quit  Allergies: 1)  ! Codeine 2)  ! Sulfa 3)  ! Niacin 4)  ! 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  She is originally from [place].  Previous to living in [place], she was in [place], [place].   Alcohol use-yes (rare)   Former Smoker     Physical Exam  General:  alert, well-developed, and well-nourished.     Lungs:  normal respiratory effort and normal breath sounds.   Heart:  normal rate, regular rhythm, and no gallop.   Extremities:  No lower extremity edema  Neurologic:  cranial nerves II-XII intact and gait normal.     Impression & Recommendations:  Problem # 1:  HYPERLIPIDEMIA (ICD-272.4)  Her updated medication list for this problem includes:    Lipitor 20 Mg Tabs (Atorvastatin calcium) ..... One by mouth once daily  Labs Reviewed: SGOT: 15 (12/21/2009)   SGPT: 16 (12/21/2009)   HDL:55 (12/21/2009), 47 (12/30/2008)  LDL:127 (12/21/2009), 117 (12/30/2008)  Chol:203 (12/21/2009), 190 (12/30/2008)  Trig:107 (12/21/2009), 128 (12/30/2008)  Problem # 2:  HYPOTHYROIDISM (ICD-244.9)  Her updated medication list for this problem includes:    Levothyroxine Sodium 100 Mcg Tabs (Levothyroxine sodium) ..... One by mouth once daily  Labs Reviewed: TSH: 0.535 (12/21/2009)    HgBA1c: 5.8 (12/21/2009) Chol: 203 (12/21/2009)   HDL: 55 (12/21/2009)   LDL: 127 (12/21/2009)   TG: 107 (12/21/2009)  Problem # 3:  CHRONIC OBSTRUCTIVE PULMONARY DISEASE (ICD-496) Assessment: Improved  Her updated medication list for this problem includes:    Advair Diskus 250-50 Mcg/dose Aepb (Fluticasone-salmeterol) .Marland Kitchen... 1 inhalation two times a day    Spiriva Handihaler 18 Mcg Caps (Tiotropium bromide monohydrate) ..... Inhale contents on one capsule via handihaler daily...  Pulmonary Functions Reviewed: FEV1: 1.10 (11/10/2009)   FEV  25-75: 0.61 (11/10/2009)   O2 sat: 95 (12/29/2009)     Vaccines Reviewed: Pneumovax: Historical (04/11/1998)   Flu Vax: Fluvax 3+ (11/17/2009)  Complete Medication List: 1)  Advair Diskus 250-50 Mcg/dose Aepb (Fluticasone-salmeterol) .Marland Kitchen.. 1 inhalation two times a day 2)  Spiriva Handihaler 18 Mcg Caps (Tiotropium bromide monohydrate) .... Inhale contents on one capsule via handihaler daily.Marland KitchenMarland Kitchen 3)  Aspirin 81 Mg Tabs (Aspirin) .... Take 1 tablet by mouth once a day 4)  Lipitor  20 Mg Tabs (Atorvastatin calcium) .... One by mouth once daily 5)  Levothyroxine Sodium 100 Mcg Tabs (Levothyroxine sodium) .... One by mouth once daily 6)  Ranitidine Hcl 150 Mg Tabs (Ranitidine hcl) .... One by mouth two times a day prn 7)  Vitamin D 1000 Unit Tabs (Cholecalciferol) .... 2 tablets by mouth once daily 8)  Magic Mouthwash  .... 1 tsp gargle & swallow up to 4 times daily as needed...  Patient Instructions: 1)  Monitor your blood pressure at home 2)  Call our office if your systolic blood pressure is higher than 140 3)  Please schedule a follow-up appointment in 6 months. 4)  BMP prior to visit, ICD-9: 401.9 5)  TSH prior to visit, ICD-9: 244.9 6)  Please return for lab work one (1) week before your next appointment.  Prescriptions: LEVOTHYROXINE SODIUM 100 MCG TABS (LEVOTHYROXINE SODIUM) one by mouth once daily  #90 x 1   Entered and Authorized by:   D. Thomos Lemons DO   Signed by:   D. Thomos Lemons DO on 12/29/2009   Method used:   [facility]        [facility]  [address] [phone]* (retail)       [address]       [place], [place]  [address]       Ph: [phone] or [phone]       Fax: [phone]   RxID:   [id number] LIPITOR 20 MG TABS (ATORVASTATIN CALCIUM) one by mouth once daily  #90 x 3   Entered and Authorized by:   D. Thomos Lemons DO   Signed by:   D. Thomos Lemons DO on 12/29/2009   Method used:   Print then Give to Patient   RxID:   [id number]    Orders Added: 1)  Est. Patient Level III [87564]    Current Allergies (reviewed today): ! CODEINE ! SULFA ! NIACIN ! * BEE STING

## 2010-02-23 NOTE — Assessment & Plan Note (Signed)
Summary: F/U BEFORE LEAVING FOR [facility]   Primary Care Altair Stanko:  Dondra Spry DO  CC:  2 month ROV....  History of Present Illness: 77 y/o WF, who used to work for [employer] in the 90's, self referred for complaints of DOE... she is an ex-smoker, no prev COPD diagnosis, no hx recurrent infections, etc... she has hx Hyperlipidemia, hx Grave's dis- s/p I-131 Rx w/ subseq hypothyroidism on Synthroid, hx GERD, hx colon polyps w/ FamHx of Lynch syndrome, and Osteoporosis (SEE BELOW).   ~  November 10, 2009:  presents w/ CC of DOE x 8yr- noted w/ yard work, housework, etc... gradual onset & not really progressive by her hx but appt request precipitated by episode of SOB several weeks ago while in her car... she notes SOB= hard to get the air out, denies any cough or phlegm, no hemoptysis, no wheezing heard, no CP or palpit, no change in swelling etc... she has not had any recent infection... she is an ex-smoker having started in her 41's & smoked up to 2ppd for 40 yrs, quitting in 2003 at age 39 (she quit then because of SOB)... her last CXR was 9/06 and showed tort Ao, clear lungs, NAD... she has not had prev PFTs.   ~  January 03, 2010:  she notes considerable improvement in her breathing & denies dyspnea at present... she feels the Advair/ Spiriva has really helped & she can walk farther, work longer, etc> all w/o DOE now... still denies CP, palpit, cough, phlegm, edema, etc... notes intermittent throat irritation & she is reminded to rinse after each Rx & we gave her a perscription for MMW for Prn use... we will hold off on repeat spirometry today... we discussed her immuniz status> she gets the yearly Flu vaccine (had it 10/11);  had PNEUMOVAX in 2001 at age 57, so she needs another post-65 vaccination & we gave that to her today;  had Tetanus shot in 2002 in [facility] after cutting her foot on glass...  she just ret from trip to [place], and is heading out to [facility] to see her brother.   Current Problems:     CHRONIC OBSTRUCTIVE PULMONARY DISEASE (ICD-496), DYSPNEA ON EXERTION (ICD-786.09), Hx of TOBACCO ABUSE (ICD-305.1)   ** SEE ABOVE ** started on ADVAIR 250- 2spBid & SPIRIVA daily.  ~  ex-smoker> started in her 20's & smoked up to 2ppd for 40 yrs, quitting in 2003 at age 77.  ~  CXR 10/11 showed COPD, tortuous Ao, NAD...  ~  PFTs 10/11 showed FVC= 1.70 (51%), FEV1= 1.10 (44%), %1sec= 65, mid-flows= 32%pred.  ~  12/11:  she reports improved breathing since starting Advair & Spiriva 10/11...  Hx of CARDIAC EVAL in 2005 by DrWall- borderline MVP>  ~  Treadmill test 7/05 by DrWall did not reveal any ischemia, but there was an exaggerated BP response (up to 200/84 in stage I).  ~  Myoview 8/05 showed mild breast attenuation, no ischemia or infarct, EF=65%...  ~  2DEcho 9/06 showed mild MR w/ borderline prolapse, otherw normal...  ~  Art Dopplers 10/07 showed normal ABIs & waveforms...  HYPERLIPIDEMIA (ICD-272.4) - on LIPITOR 20mg /d...  GRAVE'S DISEASE (ICD-242.00) - treated w/ I-131 in 2004...  HYPOTHYROIDISM (ICD-244.9) - on SYNTHROID 158mcg/d...  GASTROESOPHAGEAL REFLUX DISEASE (ICD-530.81) - on ZANTAC 150mg  Bid Prn for reflux symptoms.  ~  EGD by [facility] 8/10 showed a Schatzki ring, mod HH, otherw neg...  COLONIC POLYPS (ICD-211.3) - followed by [facility]> she  had hx of ischemic colitis in past...  ~  colonoscopy 8/10 showed scattered divertics, 1 diminutive polyp= hyperplastic.  ~  MRI Abd 9/10 showed mild biliary dil- related to prev cholecystectomy, pancreas divisum, otherw neg.  OSTEOPOROSIS (ICD-733.00) - on RECLAST infusions yearly...  R/O GENETIC SUSCEPTIBILITY OTHER MALIGNANT NEOPLASM (ICD-V84.09) - there is a family hx of [facility] Syndrome & she has been evaluated at the [facility]> she was negative for the MLH1 mutation previously identified in members of her family...   Preventive Screening-Counseling & Management  Alcohol-Tobacco     Smoking  Status: quit     Year Quit: 2003  Allergies: 1)  ! Codeine 2)  ! Sulfa 3)  ! Niacin 4)  ! * Bee Sting  Comments:  Nurse/Medical Assistant: The patient's medications and allergies were reviewed with the patient and were updated in the Medication and Allergy Lists.  Past History:  Past Medical History: DYSPNEA ON EXERTION (ICD-786.09) CHRONIC OBSTRUCTIVE PULMONARY DISEASE (ICD-496) Hx of TOBACCO ABUSE (ICD-305.1) HYPERLIPIDEMIA (ICD-272.4) GRAVE'S DISEASE (ICD-242.00) HYPOTHYROIDISM (ICD-244.9) GASTROESOPHAGEAL REFLUX DISEASE (ICD-530.81) COLONIC POLYPS (ICD-211.3) R/O GENETIC SUSCEPTIBILITY OTHER MALIGNANT NEOPLASM (ICD-V84.09) OSTEOPOROSIS (ICD-733.00)  Past Surgical History: Appendectomy 1960 Cholecystectomy 1960 Tonsillectomy 1941 Cataract extraction 2002  Family History: Reviewed history from 11/10/2009 and no changes required. Mother deceased at age 42 secondary to complications of septic shock.  Her mother was noted to have rheumatoid arthritis.  Father deceased at age 2, presumed secondary to old age, but complications of hip fracture.  The patient has a brother with known coronary artery disease status post stents.  His coronary artery disease was first discovered at age 35, and the patient had multiple siblings with colon cancer.  Social History: Reviewed history from 11/10/2009 and no changes required. 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  She is originally from [place].  Previous to living in [place], she was in [place], [place].   Alcohol use-yes (rare)   Former Smoker   Review of Systems      See HPI       The patient complains of dyspnea on exertion.  The patient denies anorexia, fever, weight loss, weight gain, vision loss, decreased hearing, hoarseness, chest pain, syncope, peripheral edema, prolonged cough, headaches, hemoptysis, abdominal pain, melena, hematochezia, severe  indigestion/heartburn, hematuria, incontinence, muscle weakness, suspicious skin lesions, transient blindness, difficulty walking, depression, unusual weight change, abnormal bleeding, enlarged lymph nodes, and angioedema.    Vital Signs:  Patient profile:   77 year old female Height:      68 inches Weight:      173.13 pounds BMI:     26.42 O2 Sat:      96 % on Room air Temp:     98.1 degrees F oral Pulse rate:   75 / minute BP sitting:   122 / 68  (right arm) Cuff size:   regular  Vitals Entered By: Randell Loop CMA (January 03, 2010 3:05 PM)  O2 Sat at Rest %:  96 O2 Flow:  Room air CC: 2 month ROV... Is Patient Diabetic? No Pain Assessment Patient in pain? no      Comments NO CHANGES IN MEDS TODAY   Physical Exam  Additional Exam:  WD, WN, 77 y/o WF in NAD... GENERAL:  Alert & oriented; pleasant & cooperative... HEENT:  [place]/AT, EOM-wnl, PERRLA, EACs-clear, TMs-wnl, NOSE-clear, THROAT-clear & wnl. NECK:  Supple w/ fairROM; no JVD; normal carotid impulses w/o  bruits; no thyromegaly or nodules palpated; no lymphadenopathy. CHEST:  Clear to P & A w/ decr BS bilat- without wheezes/ rales/ or rhonchi heard... HEART:  Regular Rhythm; without murmurs/ rubs/ or gallops detected... ABDOMEN:  Soft & nontender; normal bowel sounds; no organomegaly or masses palpated... EXT: without deformities or arthritic changes; no varicose veins/ venous insuffic/ or edema. NEURO:  CN's intact; motor testing normal; sensory testing normal; gait normal & balance OK. DERM:  No lesions noted; no rash etc...    Impression & Recommendations:  Problem # 1:  DYSPNEA ON EXERTION (ICD-786.09) Much improved on the Advair & Spiriva... continue both meds, rinse after each use, may use MMW as needed... Her updated medication list for this problem includes:    Advair Diskus 250-50 Mcg/dose Aepb (Fluticasone-salmeterol) .Marland Kitchen... 1 inhalation two times a day    Spiriva Handihaler 18 Mcg Caps (Tiotropium  bromide monohydrate) ..... Inhale contents on one capsule via handihaler daily...  Problem # 2:  CHRONIC OBSTRUCTIVE PULMONARY DISEASE (ICD-496) Mod airflow obstruction on PFT 10/11> FEV1 was 1.10... we will repeat on follow up in 6 mo... she had Pneumovax in 2001 at age 17, therefore she will need f/u vaccination per guidelines & this vaccine was given today. Her updated medication list for this problem includes:    Advair Diskus 250-50 Mcg/dose Aepb (Fluticasone-salmeterol) .Marland Kitchen... 1 inhalation two times a day    Spiriva Handihaler 18 Mcg Caps (Tiotropium bromide monohydrate) ..... Inhale contents on one capsule via handihaler daily...  Problem # 3:  HYPERLIPIDEMIA (ICD-272.4) Followed by Leland Her on Lip20... Her updated medication list for this problem includes:    Lipitor 20 Mg Tabs (Atorvastatin calcium) ..... One by mouth once daily  Problem # 4:  HYPOTHYROIDISM (ICD-244.9) Followed by DrYoo on Synth100>  she is s/p I-131 Rx for yperthyroidism... Her updated medication list for this problem includes:    Levothyroxine Sodium 100 Mcg Tabs (Levothyroxine sodium) ..... One by mouth once daily  Problem # 5:  Medical issues managed by DrYoo>>>  Complete Medication List: 1)  Advair Diskus 250-50 Mcg/dose Aepb (Fluticasone-salmeterol) .Marland Kitchen.. 1 inhalation two times a day 2)  Spiriva Handihaler 18 Mcg Caps (Tiotropium bromide monohydrate) .... Inhale contents on one capsule via handihaler daily.Marland KitchenMarland Kitchen 3)  Aspirin 81 Mg Tabs (Aspirin) .... Take 1 tablet by mouth once a day 4)  Lipitor 20 Mg Tabs (Atorvastatin calcium) .... One by mouth once daily 5)  Levothyroxine Sodium 100 Mcg Tabs (Levothyroxine sodium) .... One by mouth once daily 6)  Ranitidine Hcl 150 Mg Tabs (Ranitidine hcl) .... One by mouth two times a day prn 7)  Vitamin D 1000 Unit Tabs (Cholecalciferol) .... 2 tablets by mouth once daily 8)  Magic Mouthwash  .... 1 tsp gargle & swallow up to 4 times daily as needed...  Other  Orders: Pneumococcal Vaccine ([address]) Admin 1st Vaccine (60454)  Patient Instructions: 1)  Today we updated your med list- see below..... 2)  We wrote a new perscription for Magic Mouthwash to use 1 tsp (gargle & swallow) up to 4 times daily as needed for throat symptoms.Marland KitchenMarland Kitchen 3)  Today we gave you the PNEUMONIA vaccine> this is the last one of these that you will need (unless they change the rules on [place]!) 4)  Have a wonderful  time in [place], and be safe... 5)  Merry Christmas!!! 6)  Please schedule a follow-up appointment in 6 months, sooner as needed. Prescriptions: MAGIC MOUTHWASH 1 tsp gargle & swallow up to 4 times daily  as needed...  #4 oz x prn   Entered and Authorized by:   Michele Mcalpine MD   Signed by:   Michele Mcalpine MD on 01/03/2010   Method used:   Print then Give to Patient   RxID:   [id number]    Immunizations Administered:  Pneumonia Vaccine:    Vaccine Type: Pneumovax (Medicare)    Site: left deltoid    Mfr: Merck    Dose: 0.5 ml    Route: IM    Given by: Randell Loop CMA    Exp. Date: 06/01/2011    Lot #: [id number]    VIS given: 12/27/08 version given January 03, 2010.

## 2010-06-09 NOTE — Assessment & Plan Note (Signed)
[facility]                             PRIMARY CARE OFFICE NOTE   Amanda Boyer, Amanda Boyer                   MRN:          [id number]  DATE:11/06/2005                            DOB:          [birth year]    CHIEF COMPLAINT:  New patient to practice.   HISTORY OF PRESENT ILLNESS:  The patient is a 77 year old white female here  to establish primary care.  The patient really has not had a primary care  physician in the past.  She was seen once by Dr. Laury Axon over at the [facility]  office.  She has been followed by Dr. Talmage Nap and also her OB/GYN Dr. Thomasena Edis,  who have been monitoring primary care issues.   The patient has a history of Graves' disease and ultimately underwent  radioactive iodine therapy in February of 2004.  She is iatrogenically  hypothyroid and currently on levothyroxine.  The patient also notes a  history of elevated cholesterol for which she is on Lipitor.  She was put on  Niaspan by Dr. Laury Axon, but the patient could not tolerate due to symptoms of  severe flushing.  The patient is known to have elevated triglycerides, as  well as a low HDL.   She denies any cardiac history.  She did have some shortness of breath and  was referred to Dr. Daleen Squibb for evaluation.  A 2D echo was performed.  The  patient had normal LV function.  Normal size and ejection fraction was also  noted to be normal.  She does have a long history of tobacco use, quitting  in 2003, but has greater than a 45 pack year history.   PAST MEDICAL HISTORY SUMMARY:  1. Hyperlipidemia.  2. History of gastroesophageal reflux disease.  3. History of Graves' disease, status post radioactive ion therapy in      February of 2004.  4. Iatrogenic hypothyroidism.  5. History of colon polyps.  6. A history of tobacco abuse.  7. Status post cholecystectomy in 1960.  8. Appendectomy in 1960.  9. Tonsils and adenoids in 1941.   CURRENT MEDICATIONS:  1. Levothyroxine 88 mcg once a  day.  2. Boniva 150 mg q. monthly.  3. Lipitor 40 mg nightly.   ALLERGIES TO MEDICATIONS:  Include PENICILLIN, which causes hives.  CODEINE  causes syncope.  SULFA, nausea.  The patient is also highly allergic to BEE  STINGS.   SOCIAL HISTORY:  The patient is widowed.  Currently lives alone.  Still  works part time as a Artist.  The patient has 1 daughter.  She  is originally from [place].  Previous to living in [place], she was  in [place], [place].   FAMILY HISTORY:  Mother deceased at age 61 secondary to complications of  septic shock.  Her mother was noted to have rheumatoid arthritis.  Father  deceased at age 70, presumed secondary to old age, but complications of hip  fracture.  The patient has a brother with known coronary artery disease  status post stents.  His coronary artery disease was first discovered at age  23, and the  patient had multiple siblings with colon cancer.   HABITS:  She seldom drinks.  Tobacco history is noted above.   PREVENTATIVE CARE HISTORY:  Her last Pap was in January of 2007.  So was her  last mammogram.  Last tetanus shot was 2004.  Last Pneumovax 2000.  Last  colon screening in 2005 and performed by Dr. Troy Sine.   REVIEW OF SYSTEMS:  No fevers or chills.  The patient denies any HEENT  symptoms.  No chest pain.  Has chronic dyspnea on exertion.  Denies any  heartburn, nausea or vomiting, constipation, diarrhea.  The patient does  complain of mild left leg discomfort with walking.  She has not had carotid  Doppler screening in some time.   PHYSICAL EXAM:  VITAL SIGNS:  Weight 181 pounds, temperature 97.6, pulse is  70, a BP is 141/77 in the left arm in a seated position.  GENERAL:  The patient is a very pleasant, well-developed, well-nourished 26-  year-old white female in no apparent distress.  HEENT:  Normocephalic, atraumatic.  Pupils are equal and reactive to light  bilaterally.  Extraocular motility was intact.  The  patient was anicteric.  Conjunctivae was within normal limits.  There is evidence of a left  iridectomy.  Some opacification of the right cornea.  Oropharyngeal exam was  unremarkable.  External auditory canals and tympanic membranes were clear on  the right side and mild cerumen on the left side.  NECK:  Supple.  No adenopathy, carotid bruit, or thyromegaly.  CHEST:  Mildly decreased breath sounds at the bases.  Otherwise, clear.  No  rhonchi, rales, or wheezing.  CARDIOVASCULAR:  Regular rate and rhythm.  No significant murmurs, rubs, or  gallops appreciated.  ABDOMEN:  Slightly protuberant.  The patient had a scar in the mid abdomen.  No organomegaly.  Positive bowel sounds.  MUSCULOSKELETAL:  No clubbing, cyanosis, or edema.  SKIN:  Warm and dry.  NEUROLOGIC:  Cranial nerves 2-12 grossly intact.  The patient had decreased  pedis dorsalis pulses, not palpable in the left lower extremity, diminished  on the right lower extremity.   IMPRESSION/RECOMMENDATIONS:  1. Hyperlipidemia.  2. History of tobacco abuse.  3. Possible left lower extremity claudication.  4. History of Graves' disease status post radioactive iodine therapy.  5. History of osteoporosis.  6. Dyspnea, probable chronic obstructive pulmonary disease.  7. Health maintenance.   RECOMMENDATIONS:  The patient will be sent for repeat labs including a lipid  panel, A1C, and TSH.  With elevated triglycerides, the patient may benefit  from addition of fenofibrate to Lipitor.   She will be referred to [facility] vascular center for ABI of her lower  extremities, and also screening carotid Dopplers.  On followup visit we will  discuss the need for pulmonary function testing.  I did review an older  chest x-ray from 2006, which did show signs of chronic COPD with flattening  of her diaphragm.  Followup time is in approximately 2 to 3 weeks.       Barbette Hair. Artist Pais, DO      RDY/MedQ  DD:  11/06/2005 DT:  11/07/2005  Job  #:  [id number]

## 2010-07-07 ENCOUNTER — Telehealth: Payer: Self-pay | Admitting: Internal Medicine

## 2010-07-07 MED ORDER — LEVOTHYROXINE SODIUM 100 MCG PO TABS: 100.0000 ug | ORAL_TABLET | Freq: Every day | ORAL | Status: DC

## 2010-07-07 NOTE — Telephone Encounter (Signed)
Rx refill sent to pharmacy. Patient is due office visit with labs

## 2010-07-07 NOTE — Telephone Encounter (Signed)
Refill- levothyroxine tablet. Take one tablet by mouth every day. Qty 90. Last fill 3.14.12

## 2010-07-11 ENCOUNTER — Telehealth: Payer: Self-pay | Admitting: *Deleted

## 2010-07-11 DIAGNOSIS — I10 Essential (primary) hypertension: Secondary | ICD-10-CM

## 2010-07-11 DIAGNOSIS — E039 Hypothyroidism, unspecified: Secondary | ICD-10-CM

## 2010-07-11 LAB — TSH: TSH: 0.931 u[IU]/mL (ref 0.350–4.500)

## 2010-07-11 NOTE — Telephone Encounter (Signed)
Received call from Katrina. She did not have order for pt's blood draw today. Order entered and faxed to the lab.

## 2010-07-12 LAB — BASIC METABOLIC PANEL WITH GFR
BUN: 19 mg/dL (ref 6–23)
CO2: 23 mEq/L (ref 19–32)
Calcium: 9.5 mg/dL (ref 8.4–10.5)
Chloride: 103 mEq/L (ref 96–112)
Creat: 0.99 mg/dL (ref 0.50–1.10)
GFR, Est African American: >60 mL/min (ref >60)
GFR, Est Non African American: 55 mL/min — ABNORMAL LOW (ref >60)
Glucose, Bld: 92 mg/dL (ref 70–99)
Potassium: 4.4 mEq/L (ref 3.5–5.3)
Sodium: 138 mEq/L (ref 135–145)

## 2010-07-14 ENCOUNTER — Encounter: Payer: Self-pay | Admitting: Family

## 2010-07-17 ENCOUNTER — Encounter: Payer: Self-pay | Admitting: Family

## 2010-07-17 ENCOUNTER — Ambulatory Visit: Payer: Self-pay | Admitting: Internal Medicine

## 2010-07-17 ENCOUNTER — Ambulatory Visit (INDEPENDENT_AMBULATORY_CARE_PROVIDER_SITE_OTHER): Payer: Medicare Other | Admitting: Family

## 2010-07-17 DIAGNOSIS — M810 Age-related osteoporosis without current pathological fracture: Secondary | ICD-10-CM

## 2010-07-17 DIAGNOSIS — K219 Gastro-esophageal reflux disease without esophagitis: Secondary | ICD-10-CM

## 2010-07-17 DIAGNOSIS — E039 Hypothyroidism, unspecified: Secondary | ICD-10-CM

## 2010-07-17 MED ORDER — OMEPRAZOLE 40 MG PO CPDR: 40.0000 mg | DELAYED_RELEASE_CAPSULE | Freq: Every day | ORAL | Status: DC

## 2010-07-17 MED ORDER — ZOLEDRONIC ACID 5 MG/100ML IV SOLN: 5.0000 mg | Freq: Once | INTRAVENOUS | Status: DC

## 2010-07-17 MED ORDER — ZOLEDRONIC ACID 5 MG/100ML IV SOLN: 5.0000 mg | Freq: Once | INTRAVENOUS | Status: AC

## 2010-07-17 MED ORDER — LEVOTHYROXINE SODIUM 100 MCG PO TABS: 100.0000 ug | ORAL_TABLET | Freq: Every day | ORAL | Status: DC

## 2010-07-17 MED ORDER — ATORVASTATIN CALCIUM 20 MG PO TABS: 20.0000 mg | ORAL_TABLET | Freq: Every day | ORAL | Status: DC

## 2010-07-17 NOTE — Progress Notes (Signed)
Subjective:    Patient ID: Amanda Boyer, female    DOB: November 26, 1932, 77 y.o.   MRN: [id number]  HPI [name] is a 77 yr old female who presents today for follow up.  Osteoporosis-  Reports that she was taking Boniva- but that was discontinued due to GERD.  She reports that her last reclast 03/2009.   She takes vitamin D and calcium daily.  Hypothyroid- weight has been stable.    GERD- She uses zantac- notes that she gets sharp pain in the epigastric pain- radiates under the right ribs.   Review of Systems See HPI  Past Medical History  Diagnosis Date  . Dyspnea on exertion   . COPD (chronic obstructive pulmonary disease)   . Hyperlipidemia   . Thyroid disease     hypothyroidism  . GERD (gastroesophageal reflux disease)   . Osteoporosis   . History of tobacco abuse   . Grave's disease   . Colon polyps     History   Social History  . Marital Status: Widowed    Spouse Name: N/A    Number of Children: 1  . Years of Education: N/A   Occupational History  . RETIRED    Social History Main Topics  . Smoking status: Former Games developer  . Smokeless tobacco: Not on file  . Alcohol Use: Yes     rare  . Drug Use: Not on file  . Sexually Active: Not on file   Other Topics Concern  . 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 Previous to living in [place], she was in [place], [place].    Past Surgical History  Procedure Date  . Appendectomy 1960  . Cholecystectomy 1960  . Tonsillectomy 1941  . Cataract extraction 2002    Family History  Problem Relation Age of Onset  . Arthritis Mother     rheumatoid  . Heart disease Brother     s/p stents  . Cancer Other     multiple siblings with colon cancer    Allergies  Allergen Reactions  . Codeine     REACTION: passes out  . Niacin     REACTION: Sever Flushing  . Sulfonamide Derivatives     REACTION: Nausea    Current  Outpatient Prescriptions on File Prior to Visit  Medication Sig Dispense Refill  . Alum & Mag Hydroxide-Simeth (MAGIC MOUTHWASH) SOLN Take 5 mLs by mouth. 1 tsp gargle and swallow up to four times daily as needed       . aspirin 81 MG tablet Take 81 mg by mouth daily.        . cholecalciferol (VITAMIN D) 1000 UNITS tablet Take 1,000 Units by mouth. 2 tablets by mouth once daily       . Fluticasone-Salmeterol (ADVAIR DISKUS) 250-50 MCG/DOSE AEPB Inhale 1 puff into the lungs every 12 (twelve) hours.        Marland Kitchen tiotropium (SPIRIVA) 18 MCG inhalation capsule Place 18 mcg into inhaler and inhale daily.          BP 100/70  Pulse 78  Temp(Src) 98.2 F (36.8 C) (Oral)  Resp 16  Ht 5\' 8"  (1.727 m)  Wt 165 lb 0.6 oz (74.862 kg)  BMI 25.09 kg/m2       Objective:   Physical Exam  Constitutional: She appears well-developed and well-nourished.  HENT:  Head: Atraumatic.  Cardiovascular: Regular rhythm.   Pulmonary/Chest: Effort normal  and breath sounds normal.  Musculoskeletal: She exhibits no edema.          Assessment & Plan:

## 2010-07-17 NOTE — Patient Instructions (Signed)
You will be contacted about your referral for Reclast infusion. Follow up in 6 months, sooner if problems or concerns.

## 2010-07-18 NOTE — Assessment & Plan Note (Signed)
Deteriorated. Will change zantac to PPI

## 2010-07-18 NOTE — Assessment & Plan Note (Signed)
She is due for reclast infusion.  Will refer her to [facility] for infusion.

## 2010-07-18 NOTE — Assessment & Plan Note (Signed)
TSH normal. Continue current dose of synthroid.  

## 2010-07-21 ENCOUNTER — Telehealth: Payer: Self-pay | Admitting: Family

## 2010-07-26 NOTE — Telephone Encounter (Signed)
Opened in error

## 2010-08-01 ENCOUNTER — Ambulatory Visit: Payer: Self-pay | Admitting: Pulmonary Disease

## 2010-08-04 ENCOUNTER — Encounter: Payer: Self-pay | Admitting: Pulmonary Disease

## 2010-08-07 ENCOUNTER — Encounter: Payer: Self-pay | Admitting: Pulmonary Disease

## 2010-08-07 ENCOUNTER — Telehealth: Payer: Self-pay | Admitting: Family

## 2010-08-07 ENCOUNTER — Ambulatory Visit (INDEPENDENT_AMBULATORY_CARE_PROVIDER_SITE_OTHER): Payer: Medicare Other | Admitting: Pulmonary Disease

## 2010-08-07 DIAGNOSIS — M810 Age-related osteoporosis without current pathological fracture: Secondary | ICD-10-CM

## 2010-08-07 DIAGNOSIS — J4489 Other specified chronic obstructive pulmonary disease: Secondary | ICD-10-CM

## 2010-08-07 DIAGNOSIS — K219 Gastro-esophageal reflux disease without esophagitis: Secondary | ICD-10-CM

## 2010-08-07 DIAGNOSIS — J449 Chronic obstructive pulmonary disease, unspecified: Secondary | ICD-10-CM

## 2010-08-07 DIAGNOSIS — E785 Hyperlipidemia, unspecified: Secondary | ICD-10-CM

## 2010-08-07 DIAGNOSIS — E0500 Thyrotoxicosis with diffuse goiter without thyrotoxic crisis or storm: Secondary | ICD-10-CM

## 2010-08-07 DIAGNOSIS — D126 Benign neoplasm of colon, unspecified: Secondary | ICD-10-CM

## 2010-08-07 DIAGNOSIS — E039 Hypothyroidism, unspecified: Secondary | ICD-10-CM

## 2010-08-07 MED ORDER — FLUTICASONE-SALMETEROL 250-50 MCG/DOSE IN AEPB: 1.0000 | INHALATION_SPRAY | Freq: Two times a day (BID) | RESPIRATORY_TRACT | Status: DC

## 2010-08-07 MED ORDER — TIOTROPIUM BROMIDE MONOHYDRATE 18 MCG IN CAPS: 18.0000 ug | ORAL_CAPSULE | Freq: Every day | RESPIRATORY_TRACT | Status: DC

## 2010-08-07 NOTE — Progress Notes (Signed)
Subjective:    Patient ID: Amanda Boyer, female    DOB: [birth year], 77 y.o.   MRN: [id number]  HPI 77 y/o WF, who used to work for [employer] in the 90's, self referred 10/11 for complaints of DOE... she is an ex-smoker, no prev COPD diagnosis, no hx recurrent infections, etc... she has hx Hyperlipidemia, hx Grave's dis- s/p I-131 Rx w/ subseq hypothyroidism on Synthroid, hx GERD, hx colon polyps w/ FamHx of Lynch syndrome, and Osteoporosis (SEE BELOW)> followed by DrYoo for primary care...  ~  November 10, 2009:  presents w/ CC of DOE x 27yr- noted w/ yard work, housework, etc... gradual onset & not really progressive by her hx but appt request precipitated by episode of SOB several weeks ago while in her car... she notes SOB= hard to get the air out, denies any cough or phlegm, no hemoptysis, no wheezing heard, no CP or palpit, no change in swelling etc... she has not had any recent infection... she is an ex-smoker having started in her 60's & smoked up to 2ppd for 40 yrs, quitting in 2003 at age 38 (she quit then because of SOB)... her last CXR was 9/06 and showed tort Ao, clear lungs, NAD... she has not had prev PFTs ==> eval included CXR (hyperinflation, attenuated markings, clear/ NAD); and PFTs (mod airflow obstruction w/ FEV1= 1.10liters); started on Advair & Spiriva.  ~  January 03, 2010:  she notes considerable improvement in her breathing & denies dyspnea at present... she feels the Advair/ Spiriva has really helped & she can walk farther, work longer, etc> all w/o DOE now... still denies CP, palpit, cough, phlegm, edema, etc... notes intermittent throat irritation & she is reminded to rinse after each Rx & we gave her a perscription for MMW for Prn use... we will hold off on repeat spirometry today... we discussed her immuniz status> she gets the yearly Flu vaccine (had it 10/11);  had PNEUMOVAX in 2001 at age 61, so she needs another post-65 vaccination & we gave that to her today;  had  Tetanus shot in 2002 in [facility] after cutting her foot on glass...  she just ret from trip to [place], and is heading out to [facility] to see her brother.  ~  August 07, 2010:  31mo ROV & she has had several trips visiting relatives & did quite well on her current meds, denies cough, sputum, SOB, chest discomfort, etc; needs refills for 90d supplies... She was concerned about not getting her Reclast infusion yet this yr as [facility] Her is out on leave & we volunteered to set this up for her but she declined preferring to wait until Sep12 after all her trips are done... She is due for a 12yr Tetanus vaccination & we gave her the TDAP today...   Current Problems:   CHRONIC OBSTRUCTIVE PULMONARY DISEASE (ICD-496), DYSPNEA ON EXERTION (ICD-786.09), Hx of TOBACCO ABUSE (ICD-305.1) >> on ADVAIR 250- 2spBid & SPIRIVA daily. ~  ex-smoker> started in her 57's & smoked up to 2ppd for 40 yrs, quitting in 2003 at age 41. ~  CXR 10/11 showed COPD, tortuous Ao, NAD... ~  PFTs 10/11 showed FVC= 1.70 (51%), FEV1= 1.10 (44%), %1sec= 65, mid-flows= 32%pred. ~  7/12:  she reports improved breathing since starting Advair & Spiriva 10/11...  Hx of CARDIAC EVAL in 2005 by DrWall- borderline MVP> ~  Treadmill test 7/05 by DrWall did not reveal any ischemia, but there was an exaggerated BP response (up to 200/84 in  stage I). ~  Myoview 8/05 showed mild breast attenuation, no ischemia or infarct, EF=65%... ~  2DEcho 9/06 showed mild MR w/ borderline prolapse, otherw normal... ~  Art Dopplers 10/07 showed normal ABIs & waveforms...  HYPERLIPIDEMIA (ICD-272.4) - on LIPITOR 20mg /d...  GRAVE'S DISEASE (ICD-242.00) - treated w/ I-131 in 2004... HYPOTHYROIDISM (ICD-244.9) - on SYNTHROID 138mcg/d...  GASTROESOPHAGEAL REFLUX DISEASE (ICD-530.81) - on ZANTAC 150mg  Bid Prn for reflux symptoms. ~  EGD by [facility] 8/10 showed a Schatzki ring, mod HH, otherw neg...  COLONIC POLYPS (ICD-211.3) - followed by [facility] for GI> she had hx of  ischemic colitis in past... ~  colonoscopy 8/10 showed scattered divertics, 1 diminutive polyp= hyperplastic. ~  MRI Abd 9/10 showed mild biliary dil- related to prev cholecystectomy, pancreas divisum, otherw neg.  OSTEOPOROSIS (ICD-733.00) - on RECLAST infusions yearly...  R/O GENETIC SUSCEPTIBILITY OTHER MALIGNANT NEOPLASM (ICD-V84.09) - there is a family hx of [facility] Syndrome & she has been evaluated at the [facility]> she was negative for the MLH1 mutation previously identified in members of her family...   Past Surgical History  Procedure Date  . Appendectomy 1960  . Cholecystectomy 1960  . Tonsillectomy 1941  . Cataract extraction 2002    Outpatient Encounter Prescriptions as of 08/07/2010  Medication Sig Dispense Refill  . Alum & Mag Hydroxide-Simeth (MAGIC MOUTHWASH) SOLN Take 5 mLs by mouth. 1 tsp gargle and swallow up to four times daily as needed       . aspirin 81 MG tablet Take 81 mg by mouth daily.        Marland Kitchen atorvastatin (LIPITOR) 20 MG tablet Take 1 tablet (20 mg total) by mouth daily.  90 tablet  1  . calcium carbonate (TUMS - DOSED IN MG ELEMENTAL CALCIUM) 500 MG chewable tablet Chew 2 tablets by mouth 3 (three) times daily as needed.        . cholecalciferol (VITAMIN D) 1000 UNITS tablet Take 2,000 Units by mouth daily.       . Fluticasone-Salmeterol (ADVAIR DISKUS) 250-50 MCG/DOSE AEPB Inhale 1 puff into the lungs every 12 (twelve) hours.  120 each  4  . levothyroxine (SYNTHROID, LEVOTHROID) 100 MCG tablet Take 1 tablet (100 mcg total) by mouth daily.  90 tablet  1  . omeprazole (PRILOSEC) 40 MG capsule Take 1 capsule (40 mg total) by mouth daily.  90 capsule  1  . tiotropium (SPIRIVA) 18 MCG inhalation capsule Place 1 capsule (18 mcg total) into inhaler and inhale daily.  90 capsule  4  . zoledronic acid (RECLAST) 5 MG/100ML SOLN Inject 100 mLs (5 mg total) into the vein once.  100 mL  0    Allergies  Allergen Reactions  . Codeine     REACTION:  passes out  . Niacin     REACTION: Sever Flushing  . Penicillins     Rash all over  . Sulfonamide Derivatives     REACTION: Nausea    Review of Systems        See HPI - all other systems neg except as noted...   The patient complains of dyspnea on exertion.  The patient denies anorexia, fever, weight loss, weight gain, vision loss, decreased hearing, hoarseness, chest pain, syncope, peripheral edema, prolonged cough, headaches, hemoptysis, abdominal pain, melena, hematochezia, severe indigestion/heartburn, hematuria, incontinence, muscle weakness, suspicious skin lesions, transient blindness, difficulty walking, depression, unusual weight change, abnormal bleeding, enlarged lymph nodes, and angioedema.     Objective:   Physical Exam  WD, WN, 77 y/o WF in NAD... GENERAL:  Alert & oriented; pleasant & cooperative... HEENT:  [place]/AT, EOM-wnl, PERRLA, EACs-clear, TMs-wnl, NOSE-clear, THROAT-clear & wnl. NECK:  Supple w/ fairROM; no JVD; normal carotid impulses w/o bruits; no thyromegaly or nodules palpated; no lymphadenopathy. CHEST:  Clear to P & A w/ decr BS bilat- without wheezes/ rales/ or rhonchi heard... HEART:  Regular Rhythm; without murmurs/ rubs/ or gallops detected... ABDOMEN:  Soft & nontender; normal bowel sounds; no organomegaly or masses palpated... EXT: without deformities or arthritic changes; no varicose veins/ venous insuffic/ or edema. NEURO:  CN's intact; motor testing normal; sensory testing normal; gait normal & balance OK. DERM:  No lesions noted; no rash etc...   Assessment & Plan:   COPD>  Much improved on regular dosing of her ADVAIR 250 Bid & SPIRIVA daily; continue same; we plan f/u CXR & PFT on return...  Borderline MVP/ MR>  Prev 2DEcho 2006 & eval DrWall; she is essent asymptomatic, doing well...  Hyperlipidemia>  On Lipitor 20mg /d & labs followed by [facility] Her, last FLP 11/11 w/ LDL 127 & she is reminded to take med daily & low chol, low fat diet...  Hx  Grave's Dis/ S/P I-131 Rx/ subseq Hypothyroidism>  Stable on Synthroid 133mcg/d...  GERD>  On Prilosec 40mg /d per DrYoo & improved...  Colon Polyps>  Followed by ZOXWRUEA & she reports that she is up to date...  Osteoporosis>  She is due for her Reclast infusion but DrYoo is out; we offered to set this up for her but she prefers to wait until 9/12 & she will let us know if we can be of assist to her...  Other medical issues as noted.Marland KitchenMarland Kitchen

## 2010-08-07 NOTE — Telephone Encounter (Signed)
Called patient and reminded her to drink several glasses of water prior to her reclast infusion and take tylenol ES two tabs prior.  Pt verbalizes understanding.

## 2010-08-07 NOTE — Patient Instructions (Signed)
Today we updated your med list in EPIC...    We refilled your ADVAIR & SPIRIVA...  Stay as active as possible & call for any problems...  Let's plan a follow up visit in 6 months w/ CXR & PFT's at that time.Marland KitchenMarland Kitchen

## 2010-08-08 ENCOUNTER — Ambulatory Visit (HOSPITAL_COMMUNITY): Payer: Medicare Other | Attending: Internal Medicine

## 2010-08-08 ENCOUNTER — Encounter: Payer: Self-pay | Admitting: Pulmonary Disease

## 2010-08-08 DIAGNOSIS — M810 Age-related osteoporosis without current pathological fracture: Secondary | ICD-10-CM | POA: Insufficient documentation

## 2010-11-08 ENCOUNTER — Encounter: Payer: Self-pay | Admitting: Family Medicine

## 2010-11-08 ENCOUNTER — Inpatient Hospital Stay (INDEPENDENT_AMBULATORY_CARE_PROVIDER_SITE_OTHER)
Admission: RE | Admit: 2010-11-08 | Discharge: 2010-11-08 | Disposition: A | Discharge status: Home / Self Care | Payer: Medicare Other | Source: Ambulatory Visit | Attending: Family Medicine | Admitting: Family Medicine

## 2010-11-08 DIAGNOSIS — Z23 Encounter for immunization: Secondary | ICD-10-CM

## 2010-11-08 DIAGNOSIS — IMO0002 Reserved for concepts with insufficient information to code with codable children: Secondary | ICD-10-CM

## 2010-11-15 ENCOUNTER — Ambulatory Visit

## 2010-12-25 NOTE — Progress Notes (Signed)
Summary: CUT ON LEG...[facility] Procedure room   Vital Signs:  Patient Profile:   77 Years Old Female CC:      Puncture wound on rt lower leg today with a piece of wood/needs tetanus shot Height:     68 inches Weight:      170 pounds O2 Sat:      95 % O2 treatment:    Room Air Temp:     97.8 degrees F oral Pulse rate:   79 / minute Pulse rhythm:   regular Resp:     18 per minute BP sitting:   127 / 78  (left arm) Cuff size:   regular  Vitals Entered By: Emilio Math (November 08, 2010 2:20 PM)                  Current Allergies (reviewed today): ! CODEINE ! SULFA ! NIACIN ! * BEE STINGHistory of Present Illness Chief Complaint: Puncture wound on rt lower leg today with a piece of wood/needs tetanus shot History of Present Illness:  Subjective:  Patient complains of small wound to right lower anterior leg while working outside.  A small stick scraped her leg.  She does not remember her last tetanus shot. She also requests a flu shot.  Current Meds ADVAIR DISKUS 250-50 MCG/DOSE AEPB (FLUTICASONE-SALMETEROL) 1 inhalation two times a day SPIRIVA HANDIHALER 18 MCG CAPS (TIOTROPIUM BROMIDE MONOHYDRATE) inhale contents on one capsule via handihaler daily... ASPIRIN 81 MG TABS (ASPIRIN) Take 1 tablet by mouth once a day LIPITOR 20 MG TABS (ATORVASTATIN CALCIUM) one by mouth once daily LEVOTHYROXINE SODIUM 100 MCG TABS (LEVOTHYROXINE SODIUM) one by mouth once daily RANITIDINE HCL 150 MG TABS (RANITIDINE HCL) one by mouth two times a day prn VITAMIN D 1000 UNIT TABS (CHOLECALCIFEROL) 2 tablets by mouth once daily  REVIEW OF SYSTEMS Constitutional Symptoms      Denies fever, chills, night sweats, weight loss, weight gain, and fatigue.  Eyes       Denies change in vision, eye pain, eye discharge, glasses, contact lenses, and eye surgery. Ear/Nose/Throat/Mouth       Denies hearing loss/aids, change in hearing, ear pain, ear discharge, dizziness, frequent runny nose, frequent nose  bleeds, sinus problems, sore throat, hoarseness, and tooth pain or bleeding.  Respiratory       Denies dry cough, productive cough, wheezing, shortness of breath, asthma, bronchitis, and emphysema/COPD.  Cardiovascular       Denies murmurs, chest pain, and tires easily with exhertion.    Gastrointestinal       Denies stomach pain, nausea/vomiting, diarrhea, constipation, blood in bowel movements, and indigestion. Genitourniary       Denies painful urination, kidney stones, and loss of urinary control. Neurological       Denies paralysis, seizures, and fainting/blackouts. Musculoskeletal       Denies muscle pain, joint pain, joint stiffness, decreased range of motion, redness, swelling, muscle weakness, and gout.  Skin       Denies bruising, unusual mles/lumps or sores, and hair/skin or nail changes.      Comments: Puncture wound Psych       Denies mood changes, temper/anger issues, anxiety/stress, speech problems, depression, and sleep problems.  Past History:  Past Medical History: Reviewed history from 01/03/2010 and no changes required. DYSPNEA ON EXERTION (ICD-786.09) CHRONIC OBSTRUCTIVE PULMONARY DISEASE (ICD-496) Hx of TOBACCO ABUSE (ICD-305.1) HYPERLIPIDEMIA (ICD-272.4) GRAVE'S DISEASE (ICD-242.00) HYPOTHYROIDISM (ICD-244.9) GASTROESOPHAGEAL REFLUX DISEASE (ICD-530.81) COLONIC POLYPS (ICD-211.3) R/O GENETIC SUSCEPTIBILITY OTHER MALIGNANT NEOPLASM (ICD-V84.09) OSTEOPOROSIS (  ICD-733.00)  Past Surgical History: Reviewed history from 01/03/2010 and no changes required. Appendectomy 1960 Cholecystectomy 1960 Tonsillectomy 1941 Cataract extraction 2002  Family History: Reviewed history from 11/10/2009 and no changes required. Mother deceased at age 70 secondary to complications of septic shock.  Her mother was noted to have rheumatoid arthritis.  Father deceased at age 63, presumed secondary to old age, but complications of hip fracture.  The patient has a brother with  known coronary artery disease status post stents.  His coronary artery disease was first discovered at age 73, and the patient had multiple siblings with colon cancer.  Social History: Reviewed history from 12/29/2009 and no changes required. 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  She is originally from [place].  Previous to living in [place], she was in [place], [place].  Alcohol use-yes (rare)   Former Smoker      Objective:  Appearance:  Patient appears healthy, stated age, and in no acute distress  Right lower leg pre-tibial area reveals a superficial abrasion about 1cm by 6mm.  Wound is clean without debris. Assessment New Problems: NEED PROPH VAC W/COMB DIPHTH-TETANUS-PERTUSS VAC (ICD-V06.1) ABRASION, LEG (ICD-916.0)   Plan New Orders: Flu Vaccine 18yrs + [30865] Admin 1st Vaccine [90471] Tdap => 37yrs IM [90715] Admin of Any Addtl Vaccine [90472] New Patient Level III [78469] Planning Comments:   Tdap adminstered.  Influenza vaccine administered Wound cleansed.  Bacitracin and bandage applied.  Recommend applying Bacitracin and bandage daily until healed.  Return for any signs of infection   The patient and/or caregiver has been counseled thoroughly with regard to medications prescribed including dosage, schedule, interactions, rationale for use, and possible side effects and they verbalize understanding.  Diagnoses and expected course of recovery discussed and will return if not improved as expected or if the condition worsens. Patient and/or caregiver verbalized understanding.   Orders Added: 1)  Flu Vaccine 46yrs + [90658] 2)  Admin 1st Vaccine [90471] 3)  Tdap => 72yrs IM [90715] 4)  Admin of Any Addtl Vaccine [90472] 5)  New Patient Level III [62952]   Immunizations Administered:  Influenza Vaccine:    Vaccine Type: Fluvax 3+    Site: right deltoid    Mfr: Fluarix    Dose: 0.5 ml    Route:  IM    Given by: Emilio Math    Exp. Date: 07/22/2011    Lot #: [id number]    VIS given: 08/16/09 version given November 08, 2010.    Physician counseled: yes  Tetanus Vaccine:    Vaccine Type: Tdap    Site: left deltoid    Mfr: Boostrix    Dose: 0.5 ml    Route: IM    Given by: Emilio Math    Exp. Date: 12/16/2011    Lot #: [id number]    VIS given: 12/10/07 version given November 08, 2010.    Physician counseled: yes   Immunizations Administered:  Influenza Vaccine:    Vaccine Type: Fluvax 3+    Site: right deltoid    Mfr: Fluarix    Dose: 0.5 ml    Route: IM    Given by: Emilio Math    Exp. Date: 07/22/2011    Lot #: [id number]    VIS given: 08/16/09 version given November 08, 2010.    Physician counseled: yes  Tetanus Vaccine:    Vaccine Type: Tdap    Site: left deltoid    Mfr:  Boostrix    Dose: 0.5 ml    Route: IM    Given by: Emilio Math    Exp. Date: 12/16/2011    Lot #: [id number]    VIS given: 12/10/07 version given November 08, 2010.    Physician counseled: yes  Flu Vaccine Consent Questions:    Do you have a history of severe allergic reactions to this vaccine? no    Any prior history of allergic reactions to egg and/or gelatin? no    Do you have a sensitivity to the preservative Thimersol? no    Do you have a past history of Guillan-Barre Syndrome? no    Do you currently have an acute febrile illness? no    Have you ever had a severe reaction to latex? no    Vaccine information given and explained to patient? yes    Are you currently pregnant? no

## 2011-01-02 ENCOUNTER — Encounter: Payer: Self-pay | Admitting: Family

## 2011-01-02 ENCOUNTER — Ambulatory Visit (INDEPENDENT_AMBULATORY_CARE_PROVIDER_SITE_OTHER): Payer: Medicare Other | Admitting: Family

## 2011-01-02 VITALS — BP 156/80 | HR 78 | Temp 97.8°F | Resp 16 | Ht 68.0 in | Wt 172.0 lb

## 2011-01-02 DIAGNOSIS — K219 Gastro-esophageal reflux disease without esophagitis: Secondary | ICD-10-CM | POA: Insufficient documentation

## 2011-01-02 DIAGNOSIS — E039 Hypothyroidism, unspecified: Secondary | ICD-10-CM

## 2011-01-02 LAB — TSH: TSH: 3.386 u[IU]/mL (ref 0.350–4.500)

## 2011-01-02 MED ORDER — OMEPRAZOLE 40 MG PO CPDR: 40.0000 mg | DELAYED_RELEASE_CAPSULE | Freq: Every day | ORAL | Status: DC

## 2011-01-02 MED ORDER — OMEPRAZOLE 40 MG PO CPDR: 40.0000 mg | DELAYED_RELEASE_CAPSULE | Freq: Two times a day (BID) | ORAL | Status: DC

## 2011-01-02 NOTE — Assessment & Plan Note (Signed)
Continues synthroid, obtain TSH.

## 2011-01-02 NOTE — Patient Instructions (Addendum)
Please follow up in 1 month for a medicare wellness visit.  Come fasting to this appointment.

## 2011-01-02 NOTE — Assessment & Plan Note (Signed)
Pt is on once daily prilosec.  She continues to have epigastric pain intermittently- especially when laying flat. She had an extensive work up back in 2010 with Dr. Kathy Breach which included MRCP and endoscopy (these records are reviewed) and were unrevealing except for hiatal hernia.  I will try temporarily increasing her prilosec to bid.  If no improvement, then will consider referral back to Dr. Kathy Breach.  Discomfort may be due to hiatal hernia as well.

## 2011-01-02 NOTE — Progress Notes (Signed)
Subjective:    Patient ID: Amanda Boyer, female    DOB: 08-04-1932, 77 y.o.   MRN: [id number]  HPI  Amanda Boyer is a 77 yr old female who presents today for follow up.  1) GERD- reports + epigastric pain  Often wakes her up at night.  Burning epigastric pain.  She reports some improvement with Tums. She reports that she is taking prilosec at night- 30 minutes prior to dinner.  She reports that she tries not to eat after 7PM.    2) Hypothyroid-  She reports + med compliance.    3) Osteoporosis- she had her reclast infusion last summer.  This was her 2nd infusion.        Review of Systems See HPI  Past Medical History  Diagnosis Date  . Dyspnea on exertion   . COPD (chronic obstructive pulmonary disease)   . Hyperlipidemia   . Thyroid disease     hypothyroidism  . GERD (gastroesophageal reflux disease)   . Osteoporosis   . History of tobacco abuse   . Grave's disease   . Colon polyps     History   Social History  . Marital Status: Widowed    Spouse Name: N/A    Number of Children: 1  . Years of Education: N/A   Occupational History  . RETIRED     works part time as Artist   Social History Main Topics  . Smoking status: Former Smoker    Types: Cigarettes    Quit date: 01/22/2001  . Smokeless tobacco: Not on file  . Alcohol Use: Yes     rare  . Drug Use: Not on file  . Sexually Active: Not on file   Other Topics Concern  . Not on file   Social History Narrative   Lives alone. Works part time as Artist. Originally from [place]. Previous to living in [place], she was in [place], [place].    Past Surgical History  Procedure Date  . Appendectomy 1960  . Cholecystectomy 1960  . Tonsillectomy 1941  . Cataract extraction 2002    Family History  Problem Relation Age of Onset  . Arthritis Mother     rheumatoid  . Heart disease Brother     s/p stents  . Cancer Other     multiple siblings with colon cancer     Allergies  Allergen Reactions  . Codeine     REACTION: passes out  . Niacin     REACTION: Sever Flushing  . Penicillins     Rash all over  . Sulfonamide Derivatives     REACTION: Nausea    Current Outpatient Prescriptions on File Prior to Visit  Medication Sig Dispense Refill  . Alum & Mag Hydroxide-Simeth (MAGIC MOUTHWASH) SOLN Take 5 mLs by mouth. 1 tsp gargle and swallow up to four times daily as needed       . aspirin 81 MG tablet Take 81 mg by mouth daily.        Marland Kitchen atorvastatin (LIPITOR) 20 MG tablet Take 1 tablet (20 mg total) by mouth daily.  90 tablet  1  . calcium carbonate (TUMS - DOSED IN MG ELEMENTAL CALCIUM) 500 MG chewable tablet Chew 2 tablets by mouth 3 (three) times daily as needed.        . cholecalciferol (VITAMIN D) 1000 UNITS tablet Take 2,000 Units by mouth daily.       . Fluticasone-Salmeterol (ADVAIR DISKUS) 250-50 MCG/DOSE AEPB Inhale 1 puff into  the lungs every 12 (twelve) hours.  120 each  4  . levothyroxine (SYNTHROID, LEVOTHROID) 100 MCG tablet Take 1 tablet (100 mcg total) by mouth daily.  90 tablet  1  . tiotropium (SPIRIVA) 18 MCG inhalation capsule Place 1 capsule (18 mcg total) into inhaler and inhale daily.  90 capsule  4  . zoledronic acid (RECLAST) 5 MG/100ML SOLN Inject 100 mLs (5 mg total) into the vein once.  100 mL  0    BP 156/80  Pulse 78  Temp(Src) 97.8 F (36.6 C) (Oral)  Resp 16  Ht 5\' 8"  (1.727 m)  Wt 172 lb (78.019 kg)  BMI 26.15 kg/m2       Objective:   Physical Exam  Constitutional: She appears well-developed and well-nourished.  HENT:  Head: Normocephalic and atraumatic.  Cardiovascular: Normal rate and regular rhythm.   No murmur heard. Pulmonary/Chest: Effort normal and breath sounds normal. No respiratory distress. She has no wheezes. She has no rales. She exhibits no tenderness.  Abdominal: Soft. Bowel sounds are normal. She exhibits no distension and no mass. There is no tenderness. There is no rebound and no  guarding.  Musculoskeletal: She exhibits no edema.  Psychiatric: She has a normal mood and affect. Her behavior is normal. Judgment and thought content normal.          Assessment & Plan:

## 2011-01-03 ENCOUNTER — Telehealth: Payer: Self-pay | Admitting: Family

## 2011-01-03 MED ORDER — LEVOTHYROXINE SODIUM 112 MCG PO TABS: 112.0000 ug | ORAL_TABLET | Freq: Every day | ORAL | Status: DC

## 2011-01-03 NOTE — Telephone Encounter (Signed)
Pt notified and states she has a follow up on 02/02/11 and will complete TSH level at that visit.

## 2011-01-03 NOTE — Telephone Encounter (Signed)
Pls call pt and let her know that based on her lab results I would like to increase her synthroid slightly from to 112 mcg and have her repeat her TSH in 6 weeks.  Diagnosis is hypothyroidism.

## 2011-01-29 ENCOUNTER — Telehealth: Payer: Self-pay | Admitting: Family

## 2011-01-29 ENCOUNTER — Encounter: Payer: Self-pay | Admitting: Pulmonary Disease

## 2011-01-29 ENCOUNTER — Ambulatory Visit (INDEPENDENT_AMBULATORY_CARE_PROVIDER_SITE_OTHER): Payer: Medicare Other | Admitting: Pulmonary Disease

## 2011-01-29 ENCOUNTER — Ambulatory Visit (HOSPITAL_COMMUNITY)
Admission: RE | Admit: 2011-01-29 | Discharge: 2011-01-29 | Disposition: A | Discharge status: Home / Self Care | Payer: Medicare Other | Source: Ambulatory Visit | Attending: Pulmonary Disease | Admitting: Pulmonary Disease

## 2011-01-29 DIAGNOSIS — E785 Hyperlipidemia, unspecified: Secondary | ICD-10-CM

## 2011-01-29 DIAGNOSIS — J449 Chronic obstructive pulmonary disease, unspecified: Secondary | ICD-10-CM

## 2011-01-29 DIAGNOSIS — D126 Benign neoplasm of colon, unspecified: Secondary | ICD-10-CM

## 2011-01-29 DIAGNOSIS — J4489 Other specified chronic obstructive pulmonary disease: Secondary | ICD-10-CM

## 2011-01-29 DIAGNOSIS — E039 Hypothyroidism, unspecified: Secondary | ICD-10-CM

## 2011-01-29 DIAGNOSIS — M810 Age-related osteoporosis without current pathological fracture: Secondary | ICD-10-CM

## 2011-01-29 DIAGNOSIS — K219 Gastro-esophageal reflux disease without esophagitis: Secondary | ICD-10-CM

## 2011-01-29 MED ORDER — LEVOTHYROXINE SODIUM 112 MCG PO TABS: 112.0000 ug | ORAL_TABLET | Freq: Every day | ORAL | Status: DC

## 2011-01-29 NOTE — Progress Notes (Signed)
Subjective:    Patient ID: Amanda Boyer, female    DOB: 21-Feb-1932, 78 y.o.   MRN: [id number]  HPI 78 y/o WF, who used to work for [employer] in the 90's, self referred 10/11 for complaints of DOE... she is an ex-smoker, no prev COPD diagnosis, no hx recurrent infections, etc... she has hx Hyperlipidemia, hx Grave's dis- s/p I-131 Rx w/ subseq hypothyroidism on Synthroid, hx GERD, hx colon polyps w/ FamHx of Lynch syndrome, and Osteoporosis (SEE BELOW)> followed by DrYoo for primary care...  ~  November 10, 2009:  presents w/ CC of DOE x 61yr- noted w/ yard work, housework, etc... gradual onset & not really progressive by her hx but appt request precipitated by episode of SOB several weeks ago while in her car... she notes SOB= hard to get the air out, denies any cough or phlegm, no hemoptysis, no wheezing heard, no CP or palpit, no change in swelling etc... she has not had any recent infection... she is an ex-smoker having started in her 93's & smoked up to 2ppd for 40 yrs, quitting in 2003 at age 30 (she quit then because of SOB)... her last CXR was 9/06 and showed tort Ao, clear lungs, NAD... she has not had prev PFTs ==> eval included CXR (hyperinflation, attenuated markings, clear/ NAD); and PFTs (mod airflow obstruction w/ FEV1= 1.10liters); started on Advair & Spiriva.  ~  January 03, 2010:  she notes considerable improvement in her breathing & denies dyspnea at present... she feels the Advair/ Spiriva has really helped & she can walk farther, work longer, etc> all w/o DOE now... still denies CP, palpit, cough, phlegm, edema, etc... notes intermittent throat irritation & she is reminded to rinse after each Rx & we gave her a perscription for MMW for Prn use... we will hold off on repeat spirometry today... we discussed her immuniz status> she gets the yearly Flu vaccine (had it 10/11);  had PNEUMOVAX in 2001 at age 62, so she needs another post-65 vaccination & we gave that to her today;  had  Tetanus shot in 2002 in [facility] after cutting her foot on glass...  she just ret from trip to [place], and is heading out to [facility] to see her brother.  ~  August 07, 2010:  84mo ROV & she has had several trips visiting relatives & did quite well on her current meds, denies cough, sputum, SOB, chest discomfort, etc; needs refills for 90d supplies... She was concerned about not getting her Reclast infusion yet this yr as Leland Her is out on leave & we volunteered to set this up for her but she declined preferring to wait until Sep12 after all her trips are done... She is due for a 61yr Tetanus vaccination & we gave her the TDAP today...  ~  January 29, 2011:  31mo ROV & pulmonary follow up> she is an ex-smoker quit 11/03, states her breathing is good, notes sl SOB w/ the wet, cold weather;  She is on Advair & Spiriva & takes them regularly, tolerated well;  Shew denies cough, sputum, SOB (notes DOE w/o change), CP, & notes improved stamina etc...  CXR remains clear, NAD; and PFT shows a fabulous improvement in her FEV1...          Problem List:    << Her Primary Care doctor is Sandford Craze, NP & DrHodgson >>  CHRONIC OBSTRUCTIVE PULMONARY DISEASE (ICD-496), DYSPNEA ON EXERTION (ICD-786.09), Hx of TOBACCO ABUSE (ICD-305.1) >> on ADVAIR 250- 2spBid &  SPIRIVA daily. ~  ex-smoker> started in her 30's & smoked up to 2ppd for 40 yrs, quitting in 2003 at age 50. ~  CXR 10/11 showed COPD, tortuous Ao, NAD... ~  PFTs 10/11 showed FVC= 1.70 (51%), FEV1= 1.10 (44%), %1sec= 65, mid-flows= 32%pred. ~  7/12:  she reports improved breathing since starting Advair & Spiriva 10/11... ~  CXR 1/13 showed COPD, clear lungs, NAD... ~  PFT 1/13 showed FVC=2.41 (74%), FE#V1=1.70 (71%), %1sec=71, mid-flows=53%predicted...  Hx of CARDIAC EVAL in 2005 by DrWall- borderline MVP> ~  Treadmill test 7/05 by DrWall did not reveal any ischemia, but there was an exaggerated BP response (up to 200/84 in stage I). ~  Myoview 8/05 showed mild  breast attenuation, no ischemia or infarct, EF=65%... ~  2DEcho 9/06 showed mild MR w/ borderline prolapse, otherw normal... ~  Art Dopplers 10/07 showed normal ABIs & waveforms...  HYPERLIPIDEMIA (ICD-272.4) - on LIPITOR 20mg /d...  GRAVE'S DISEASE (ICD-242.00) - treated w/ I-131 in 2004... HYPOTHYROIDISM (ICD-244.9) - on SYNTHROID - taking 1/2 tab daily  GASTROESOPHAGEAL REFLUX DISEASE (ICD-530.81) - on ZANTAC 150mg  Bid Prn for reflux symptoms. ~  [facility] by [facility] 8/10 showed a Schatzki ring, mod HH, otherw neg...  COLONIC POLYPS (ICD-211.3) - followed by [facility] for GI> she had hx of ischemic colitis in past... ~  colonoscopy 8/10 showed scattered divertics, 1 diminutive polyp= hyperplastic. ~  MRI Abd 9/10 showed mild biliary dil- related to prev cholecystectomy, pancreas divisum, otherw neg.  OSTEOPOROSIS (ICD-733.00) - on RECLAST infusions yearly...  R/O GENETIC SUSCEPTIBILITY OTHER MALIGNANT NEOPLASM (ICD-V84.09) - there is a family hx of [facility] Syndrome & she has been evaluated at the [facility]> she was negative for the MLH1 mutation previously identified in members of her family...   Past Surgical History  Procedure Date  . Appendectomy 1960  . Cholecystectomy 1960  . Tonsillectomy 1941  . Cataract extraction 2002    Outpatient Encounter Prescriptions as of 01/29/2011  Medication Sig Dispense Refill  . Alum & Mag Hydroxide-Simeth (MAGIC MOUTHWASH) SOLN Take 5 mLs by mouth. 1 tsp gargle and swallow up to four times daily as needed       . aspirin 81 MG tablet Take 81 mg by mouth daily.        Marland Kitchen atorvastatin (LIPITOR) 20 MG tablet Take 1 tablet (20 mg total) by mouth daily.  90 tablet  1  . calcium carbonate (TUMS - DOSED IN MG ELEMENTAL CALCIUM) 500 MG chewable tablet Chew 2 tablets by mouth 3 (three) times daily as needed.        . cholecalciferol (VITAMIN D) 1000 UNITS tablet Take 2,000 Units by mouth daily.       . Fluticasone-Salmeterol (ADVAIR  DISKUS) 250-50 MCG/DOSE AEPB Inhale 1 puff into the lungs every 12 (twelve) hours.  120 each  4  . levothyroxine (SYNTHROID, LEVOTHROID) 112 MCG tablet Take 1 tablet (112 mcg total) by mouth daily.  30 tablet  2  . omeprazole (PRILOSEC) 40 MG capsule Take 1 capsule (40 mg total) by mouth 2 (two) times daily.  60 capsule  2  . tiotropium (SPIRIVA) 18 MCG inhalation capsule Place 1 capsule (18 mcg total) into inhaler and inhale daily.  90 capsule  4  . zoledronic acid (RECLAST) 5 MG/100ML SOLN Inject 100 mLs (5 mg total) into the vein once.  100 mL  0    Allergies  Allergen Reactions  . Codeine     REACTION: passes out  .  Niacin     REACTION: Sever Flushing  . Penicillins     Rash all over  . Sulfonamide Derivatives     REACTION: Nausea    Current Medications, Allergies, Past Medical History, Past Surgical History, Family History, and Social History were reviewed in [employer] record.    Review of Systems        See HPI - all other systems neg except as noted...   The patient complains of dyspnea on exertion.  The patient denies anorexia, fever, weight loss, weight gain, vision loss, decreased hearing, hoarseness, chest pain, syncope, peripheral edema, prolonged cough, headaches, hemoptysis, abdominal pain, melena, hematochezia, severe indigestion/heartburn, hematuria, incontinence, muscle weakness, suspicious skin lesions, transient blindness, difficulty walking, depression, unusual weight change, abnormal bleeding, enlarged lymph nodes, and angioedema.     Objective:   Physical Exam     WD, WN, 78 y/o WF in NAD... GENERAL:  Alert & oriented; pleasant & cooperative... HEENT:  [place]/AT, EOM-wnl, PERRLA, EACs-clear, TMs-wnl, NOSE-clear, THROAT-clear & wnl. NECK:  Supple w/ fairROM; no JVD; normal carotid impulses w/o bruits; no thyromegaly or nodules palpated; no lymphadenopathy. CHEST:  Clear to P & A w/ decr BS bilat- without wheezes/ rales/ or rhonchi  heard... HEART:  Regular Rhythm; without murmurs/ rubs/ or gallops detected... ABDOMEN:  Soft & nontender; normal bowel sounds; no organomegaly or masses palpated... EXT: without deformities or arthritic changes; no varicose veins/ venous insuffic/ or edema. NEURO:  CN's intact; motor testing normal; sensory testing normal; gait normal & balance OK. DERM:  No lesions noted; no rash etc...  RADIOLOGY DATA:  Reviewed in the EPIC EMR & discussed w/ the patient...    >>CXR & PFTs resulted above...  LABORATORY DATA:  Reviewed in the EPIC EMR & discussed w/ the patient...   Assessment & Plan:   COPD>  Much improved on regular dosing of her ADVAIR 250 Bid & SPIRIVA daily; continue same, f/u 31yr sooner prn...  Borderline MVP/ MR>  Prev 2DEcho 2006 & eval DrWall; she is essent asymptomatic, doing well...  Hyperlipidemia>  On Lipitor 20mg /d & labs followed by [facility] Her, last FLP 11/11 w/ LDL 127 & she is reminded to take med daily & low chol, low fat diet...  Hx Grave's Dis/ S/P I-131 Rx/ subseq Hypothyroidism>  Stable on Synthroid 1100mcg/d...  GERD>  On Prilosec 40mg /d per DrYoo & improved...  Colon Polyps>  Followed by NFAOZHYQ & she reports that she is up to date...  Osteoporosis>  She is due for her Reclast infusion but DrYoo is out; we offered to set this up for her but she prefers to wait until 9/12 & she will let us know if we can be of assist to her...  Other medical issues as noted...   Patient's Medications  New Prescriptions   No medications on file  Previous Medications   ALUM & MAG HYDROXIDE-SIMETH (MAGIC MOUTHWASH) SOLN    Take 5 mLs by mouth. 1 tsp gargle and swallow up to four times daily as needed    ASPIRIN 81 MG TABLET    Take 81 mg by mouth daily.     CALCIUM CARBONATE (TUMS - DOSED IN MG ELEMENTAL CALCIUM) 500 MG CHEWABLE TABLET    Chew 2 tablets by mouth 3 (three) times daily as needed.     CHOLECALCIFEROL (VITAMIN D) 1000 UNITS TABLET    Take 2,000 Units by mouth  daily.    FLUTICASONE-SALMETEROL (ADVAIR DISKUS) 250-50 MCG/DOSE AEPB    Inhale 1 puff  into the lungs every 12 (twelve) hours.   TIOTROPIUM (SPIRIVA) 18 MCG INHALATION CAPSULE    Place 1 capsule (18 mcg total) into inhaler and inhale daily.   ZOLEDRONIC ACID (RECLAST) 5 MG/100ML SOLN    Inject 100 mLs (5 mg total) into the vein once.  Modified Medications   Modified Medication Previous Medication   ATORVASTATIN (LIPITOR) 20 MG TABLET atorvastatin (LIPITOR) 20 MG tablet      Take 1 tablet (20 mg total) by mouth daily.    Take 1 tablet (20 mg total) by mouth daily.   LEVOTHYROXINE (SYNTHROID, LEVOTHROID) 112 MCG TABLET levothyroxine (SYNTHROID, LEVOTHROID) 112 MCG tablet      Take 1 tablet (112 mcg total) by mouth daily.    Take 1 tablet (112 mcg total) by mouth daily.   OMEPRAZOLE (PRILOSEC) 40 MG CAPSULE omeprazole (PRILOSEC) 40 MG capsule      Take 1 capsule (40 mg total) by mouth 2 (two) times daily.    Take 1 capsule (40 mg total) by mouth 2 (two) times daily.  Discontinued Medications   No medications on file

## 2011-01-29 NOTE — Patient Instructions (Signed)
Today we updated your med list in our EPIC system...    Continue your current medications the same...  Today we did your follow up CXR & PFT...    Please call the PHONE TREE in a few days for your results...    Dial [id number] & when prompted enter your patient number followed by the # symbol...    Your patient number is:  [id number]  Stay as active as possible, and be careful...  Call for any problems...  Let's plan a follow up visit in 6 months, sooner if needed for any reason.Marland KitchenMarland Kitchen

## 2011-02-02 ENCOUNTER — Ambulatory Visit (INDEPENDENT_AMBULATORY_CARE_PROVIDER_SITE_OTHER): Payer: Medicare Other | Admitting: Family

## 2011-02-02 ENCOUNTER — Ambulatory Visit (HOSPITAL_BASED_OUTPATIENT_CLINIC_OR_DEPARTMENT_OTHER)
Admission: RE | Admit: 2011-02-02 | Discharge: 2011-02-02 | Disposition: A | Discharge status: Home / Self Care | Payer: Medicare Other | Source: Ambulatory Visit | Attending: Family | Admitting: Family

## 2011-02-02 ENCOUNTER — Encounter: Payer: Self-pay | Admitting: Family

## 2011-02-02 VITALS — BP 112/80 | HR 77 | Temp 97.5°F | Resp 20 | Ht 67.5 in | Wt 172.0 lb

## 2011-02-02 DIAGNOSIS — E785 Hyperlipidemia, unspecified: Secondary | ICD-10-CM

## 2011-02-02 DIAGNOSIS — J449 Chronic obstructive pulmonary disease, unspecified: Secondary | ICD-10-CM

## 2011-02-02 DIAGNOSIS — Z1239 Encounter for other screening for malignant neoplasm of breast: Secondary | ICD-10-CM

## 2011-02-02 DIAGNOSIS — Z0000 Encounter for general adult medical examination without abnormal findings: Secondary | ICD-10-CM

## 2011-02-02 DIAGNOSIS — Z1231 Encounter for screening mammogram for malignant neoplasm of breast: Secondary | ICD-10-CM | POA: Insufficient documentation

## 2011-02-02 DIAGNOSIS — K219 Gastro-esophageal reflux disease without esophagitis: Secondary | ICD-10-CM

## 2011-02-02 DIAGNOSIS — M810 Age-related osteoporosis without current pathological fracture: Secondary | ICD-10-CM

## 2011-02-02 DIAGNOSIS — E039 Hypothyroidism, unspecified: Secondary | ICD-10-CM

## 2011-02-02 DIAGNOSIS — J4489 Other specified chronic obstructive pulmonary disease: Secondary | ICD-10-CM

## 2011-02-02 LAB — HEPATIC FUNCTION PANEL
ALT: 19 U/L (ref 0–35)
AST: 20 U/L (ref 0–37)
Alkaline Phosphatase: 70 U/L (ref 39–117)
Bilirubin, Direct: 0.1 mg/dL (ref 0.0–0.3)
Total Bilirubin: 0.4 mg/dL (ref 0.3–1.2)

## 2011-02-02 LAB — LIPID PANEL
Cholesterol: 189 mg/dL (ref 0–200)
Total CHOL/HDL Ratio: 3.9 Ratio
Triglycerides: 142 mg/dL (ref <150)

## 2011-02-02 MED ORDER — ATORVASTATIN CALCIUM 20 MG PO TABS: 20.0000 mg | ORAL_TABLET | Freq: Every day | ORAL | Status: DC

## 2011-02-02 MED ORDER — OMEPRAZOLE 40 MG PO CPDR: 40.0000 mg | DELAYED_RELEASE_CAPSULE | Freq: Two times a day (BID) | ORAL | Status: DC

## 2011-02-02 NOTE — Assessment & Plan Note (Signed)
Obtain follow up TSH today.  Continue synthroid.

## 2011-02-02 NOTE — Patient Instructions (Addendum)
Please complete your blood work prior to leaving today. Schedule your mammogram on the first floor.  Please follow up in 6 months.

## 2011-02-02 NOTE — Assessment & Plan Note (Signed)
She is up to date on her Reclast infusions.  Due for follow up Dexa after 05/03/11.

## 2011-02-02 NOTE — Assessment & Plan Note (Signed)
Pt reports significant improvement in her GI discomfort since she increased the omeprazole to BID.  I have asked her to continue the BID dosing for a few more weeks, then try cutting back to once a day to see how she does.  She verbalizes understanding.

## 2011-02-02 NOTE — Progress Notes (Signed)
Subjective:    Patient ID: Amanda Boyer, female    DOB: 08/30/1932, 77 y.o.   MRN: [id number]  HPI  Subjective:   Patient here for Medicare annual wellness visit and management of other chronic and acute problems.  Hypothyroid-  Pt is currently on 112 mcg of synthroid.  Synthroid was increased 1 year ago.   Hyperlipidemia- She reports that she is tolerating atorvastatin without myalgias.  She continues calcium and vitamin D.  Osteoporosis- last reclast was July 2012. She is due for a Dexa in April 2013  COPD- following with Dr. Kriste Basque, reports that she is feeling well.    GERD- reports feeling "great"- no longer having discomfort.   Preventative- She is up to date on her flu shot.  She has completed pneumovax series.  Tetanus is up to date.  Had zostavax.  She reports that her last bone density was 2 yrs ago.  Last colo 2010.  Last mammogram- 2011 with Dr. Artist Pais.      Roster of Physicians Providing Medical Care to Patient:  Dr. Alroy Dust Dr. Jolayne Haines- [facility]- Durwin Nora he follows her pessary. Dr. Kinnie Scales- GI  Activities of Daily Living  In your present state of health, do you have any difficulty performing the following activities? Preparing food and eating?: No  Bathing yourself: No  Getting dressed: No  Using the toilet:No  Moving around from place to place: No  In the past year have you fallen or had a near fall?:No     Home Safety: Has smoke detector and wears seat belts. No firearms. She uses sunscreen in the sun.   Diet and Exercise  Current exercise habits: She is very active- own housework.  Dietary issues discussed: healthy diet - tries to eat a healthy diet.    Depression Screen  (Note: if answer to either of the following is "Yes", then a more complete depression screening is indicated)  Q1: Over the past two weeks, have you felt down, depressed or hopeless?no  Q2: Over the past two weeks, have you felt little interest or pleasure in doing  things? no   The following portions of the patient's history were reviewed and updated as appropriate: allergies, current medications, past family history, past medical history, past social history, past surgical history and problem list.    Objective:   Vision:see nursing. Hearing: grossly intact- able to hear forced whisper at 6 feet Body mass index:see nursing Cognitive Impairment Assessment: cognition, memory and judgment appear normal.   Assessment:   Medicare wellness utd on preventive parameters except mammo- order today. Plan:     Pt will need dexa after 05/03/11.  During the course of the visit the patient was educated and counseled about appropriate screening and preventive services including:  Screening mammography  Bone densitometry screening  Diabetes screening  Nutrition counseling   Vaccines / LABS Obtain fasting labs as below.   Patient Instructions (the written plan) was given to the patient.      Review of Systems  Constitutional:       She reports some weight gain in the last 2 years.    HENT: Negative for congestion.   Eyes:       She wears glasses for distance.  Respiratory: Negative for cough and shortness of breath.   Cardiovascular: Negative for chest pain.  Gastrointestinal: Negative for vomiting, diarrhea and constipation.  Genitourinary: Negative for dysuria, urgency and hematuria.  Musculoskeletal: Negative for myalgias and arthralgias.  Skin: Negative for rash.  Neurological: Positive for headaches.       Occasional headaches    Hematological: Negative for adenopathy.  Psychiatric/Behavioral:       Denies depression/anxiety       Objective:   Physical Exam  Constitutional: She is oriented to person, place, and time. She appears well-developed and well-nourished.  HENT:  Head: Normocephalic and atraumatic.  Right Ear: Tympanic membrane and ear canal normal.  Mouth/Throat: No oropharyngeal exudate, posterior oropharyngeal edema or  posterior oropharyngeal erythema.       L TM occluded by cerumen Upper denture.  Eyes: Pupils are equal, round, and reactive to light. No scleral icterus.  Neck: Normal range of motion. Neck supple. No thyromegaly present.  Cardiovascular: Normal rate and regular rhythm.   No murmur heard. Pulmonary/Chest: Effort normal and breath sounds normal. No respiratory distress. She has no wheezes. She has no rales. She exhibits no tenderness.  Abdominal: Soft. Bowel sounds are normal. She exhibits no distension and no mass. There is no tenderness. There is no rebound.  Genitourinary:       Breast/pelvic deferred to GYN.  Musculoskeletal: She exhibits no edema.  Lymphadenopathy:    She has no cervical adenopathy.  Neurological: She is alert and oriented to person, place, and time. No cranial nerve deficit. Coordination normal.  Reflex Scores:      Patellar reflexes are 2+ on the right side and 2+ on the left side.         Assessment & Plan:

## 2011-02-02 NOTE — Assessment & Plan Note (Signed)
Obtain FLP, continue statin. 

## 2011-02-02 NOTE — Assessment & Plan Note (Signed)
Clinically stable on spiriva and advair. Followed by Dr. Kriste Basque.

## 2011-02-05 ENCOUNTER — Telehealth: Payer: Self-pay | Admitting: Family

## 2011-02-05 NOTE — Telephone Encounter (Signed)
Opened in error

## 2011-02-06 ENCOUNTER — Telehealth: Payer: Self-pay | Admitting: *Deleted

## 2011-02-06 NOTE — Telephone Encounter (Signed)
Pt.notified

## 2011-02-06 NOTE — Telephone Encounter (Signed)
Received message from pt requesting lab results. States she needs to send in her mail order refills but needs to know if any dosages will be changing. Please advise.

## 2011-02-06 NOTE — Telephone Encounter (Signed)
Cholesterol, thyroid, liver tests are all normal.  No changes to med dosages.  OK to send refill requests.

## 2011-02-11 ENCOUNTER — Encounter: Payer: Self-pay | Admitting: Pulmonary Disease

## 2011-05-02 ENCOUNTER — Other Ambulatory Visit: Payer: Self-pay | Admitting: Family

## 2011-06-01 ENCOUNTER — Telehealth: Payer: Self-pay | Admitting: Family

## 2011-06-01 DIAGNOSIS — M810 Age-related osteoporosis without current pathological fracture: Secondary | ICD-10-CM

## 2011-06-01 NOTE — Telephone Encounter (Signed)
Please call pt and let her know that my records show she is due for her bone density.  Please arrange.

## 2011-06-25 NOTE — Telephone Encounter (Signed)
Addended by: Mervin Kung A on: 06/25/2011 09:42 AM   Modules accepted: Orders

## 2011-06-26 ENCOUNTER — Ambulatory Visit (INDEPENDENT_AMBULATORY_CARE_PROVIDER_SITE_OTHER)
Admission: RE | Admit: 2011-06-26 | Discharge: 2011-06-26 | Disposition: A | Discharge status: Home / Self Care | Payer: Medicare Other | Source: Ambulatory Visit

## 2011-06-26 ENCOUNTER — Other Ambulatory Visit: Payer: Medicare Other

## 2011-06-26 ENCOUNTER — Other Ambulatory Visit: Payer: Self-pay | Admitting: Family

## 2011-06-26 DIAGNOSIS — M810 Age-related osteoporosis without current pathological fracture: Secondary | ICD-10-CM

## 2011-06-27 ENCOUNTER — Telehealth: Payer: Self-pay | Admitting: Family

## 2011-07-02 ENCOUNTER — Encounter: Payer: Self-pay | Admitting: Family

## 2011-07-03 NOTE — Telephone Encounter (Signed)
Opened in error

## 2011-07-30 ENCOUNTER — Ambulatory Visit (INDEPENDENT_AMBULATORY_CARE_PROVIDER_SITE_OTHER): Payer: Medicare Other | Admitting: Pulmonary Disease

## 2011-07-30 ENCOUNTER — Encounter: Payer: Self-pay | Admitting: Pulmonary Disease

## 2011-07-30 VITALS — BP 132/80 | HR 75 | Temp 97.0°F | Ht 68.0 in | Wt 176.0 lb

## 2011-07-30 DIAGNOSIS — E785 Hyperlipidemia, unspecified: Secondary | ICD-10-CM

## 2011-07-30 DIAGNOSIS — K219 Gastro-esophageal reflux disease without esophagitis: Secondary | ICD-10-CM

## 2011-07-30 DIAGNOSIS — J4489 Other specified chronic obstructive pulmonary disease: Secondary | ICD-10-CM

## 2011-07-30 DIAGNOSIS — J449 Chronic obstructive pulmonary disease, unspecified: Secondary | ICD-10-CM

## 2011-07-30 DIAGNOSIS — E0500 Thyrotoxicosis with diffuse goiter without thyrotoxic crisis or storm: Secondary | ICD-10-CM

## 2011-07-30 DIAGNOSIS — E039 Hypothyroidism, unspecified: Secondary | ICD-10-CM

## 2011-07-30 DIAGNOSIS — M810 Age-related osteoporosis without current pathological fracture: Secondary | ICD-10-CM

## 2011-07-30 DIAGNOSIS — D126 Benign neoplasm of colon, unspecified: Secondary | ICD-10-CM

## 2011-07-30 MED ORDER — TIOTROPIUM BROMIDE MONOHYDRATE 18 MCG IN CAPS: 18.0000 ug | ORAL_CAPSULE | Freq: Every day | RESPIRATORY_TRACT | Status: DC

## 2011-07-30 MED ORDER — FLUTICASONE-SALMETEROL 250-50 MCG/DOSE IN AEPB: 1.0000 | INHALATION_SPRAY | Freq: Two times a day (BID) | RESPIRATORY_TRACT | Status: DC

## 2011-07-30 NOTE — Patient Instructions (Addendum)
Today we updated your med list in our EPIC system...    Continue your current medications the same...  Exercise is the BEST thing for you>    [facility] a [facility] program [facility], like [employer], or similar...  Call for any problems or is we can be of service in any way...  Let's plan a follow up visit early in 2014.Marland KitchenMarland Kitchen

## 2011-07-30 NOTE — Progress Notes (Signed)
Subjective:    Patient ID: Amanda Boyer, female    DOB: 11-07-1932, 78 y.o.   MRN: [id number]  HPI 78 y/o WF, who used to work for [employer] in the 90's, self referred 10/11 for complaints of DOE... she is an ex-smoker, no prev COPD diagnosis, no hx recurrent infections, etc... she has hx Hyperlipidemia, hx Grave's dis- s/p I-131 Rx w/ subseq hypothyroidism on Synthroid, hx GERD, hx colon polyps w/ FamHx of Lynch syndrome, and Osteoporosis (SEE BELOW)> followed by DrYoo for primary care...  ~  November 10, 2009:  presents w/ CC of DOE x 20yr- noted w/ yard work, housework, etc... gradual onset & not really progressive by her hx but appt request precipitated by episode of SOB several weeks ago while in her car... she notes SOB= hard to get the air out, denies any cough or phlegm, no hemoptysis, no wheezing heard, no CP or palpit, no change in swelling etc... she has not had any recent infection... she is an ex-smoker having started in her 25's & smoked up to 2ppd for 40 yrs, quitting in 2003 at age 32 (she quit then because of SOB)... her last CXR was 9/06 and showed tort Ao, clear lungs, NAD... she has not had prev PFTs ==> eval included CXR (hyperinflation, attenuated markings, clear/ NAD); and PFTs (mod airflow obstruction w/ FEV1= 1.10liters); started on Advair & Spiriva.  ~  January 03, 2010:  she notes considerable improvement in her breathing & denies dyspnea at present... she feels the Advair/ Spiriva has really helped & she can walk farther, work longer, etc> all w/o DOE now... still denies CP, palpit, cough, phlegm, edema, etc... notes intermittent throat irritation & she is reminded to rinse after each Rx & we gave her a perscription for MMW for Prn use... we will hold off on repeat spirometry today... we discussed her immuniz status> she gets the yearly Flu vaccine (had it 10/11);  had PNEUMOVAX in 2001 at age 65, so she needs another post-65 vaccination & we gave that to her today;  had  Tetanus shot in 2002 in [facility] after cutting her foot on glass...  she just ret from trip to [place], and is heading out to [facility] to see her brother.  ~  August 07, 2010:  52mo ROV & she has had several trips visiting relatives & did quite well on her current meds, denies cough, sputum, SOB, chest discomfort, etc; needs refills for 90d supplies... She was concerned about not getting her Reclast infusion yet this yr as [facility] Her is out on leave & we volunteered to set this up for her but she declined preferring to wait until Sep12 after all her trips are done... She is due for a 37yr Tetanus vaccination & we gave her the TDAP today...  ~  January 29, 2011:  32mo ROV & pulmonary follow up> she is an ex-smoker quit 11/03, states her breathing is good, notes sl SOB w/ the wet, cold weather;  She is on Advair & Spiriva & takes them regularly, tolerated well;  Shew denies cough, sputum, SOB (notes DOE w/o change), CP, & notes improved stamina etc...  CXR remains clear, NAD; and PFT shows a fabulous improvement in her FEV1...  ~  July 30, 2011:  32mo ROV & Amanda Boyer has been doing well, no new complaints or concerns;  She continues to follow w/ Sandford Craze at the [facility] office for Primary Care & had a Medicare Physical 1/13... Her breathing remains good on  Advair & Spiriva...    We reviewed prob list, meds, xrays and labs> see below for updates>>          Problem List:    << Her Primary Care doctor is Sandford Craze, NP & DrHodgson >>  CHRONIC OBSTRUCTIVE PULMONARY DISEASE (ICD-496), DYSPNEA ON EXERTION (ICD-786.09), Hx of TOBACCO ABUSE (ICD-305.1) >> on ADVAIR 250- 2spBid & SPIRIVA daily. ~  ex-smoker> started in her 73's & smoked up to 2ppd for 40 yrs, quitting in 2003 at age 75. ~  CXR 10/11 showed COPD, tortuous Ao, NAD... ~  PFTs 10/11 showed FVC= 1.70 (51%), FEV1= 1.10 (44%), %1sec= 65, mid-flows= 32%pred. ~  7/12:  she reports improved breathing since starting Advair & Spiriva 10/11... ~  CXR 1/13 showed COPD,  clear lungs, NAD... ~  PFT 1/13 showed FVC=2.41 (74%), FE#V1=1.70 (71%), %1sec=71, mid-flows=53%predicted...  Hx of CARDIAC EVAL in 2005 by DrWall- borderline MVP> ~  Treadmill test 7/05 by DrWall did not reveal any ischemia, but there was an exaggerated BP response (up to 200/84 in stage I). ~  Myoview 8/05 showed mild breast attenuation, no ischemia or infarct, EF=65%... ~  2DEcho 9/06 showed mild MR w/ borderline prolapse, otherw normal... ~  Art Dopplers 10/07 showed normal ABIs & waveforms...  HYPERLIPIDEMIA (ICD-272.4) - on LIPITOR 20mg /d... ~  FLP 1/13 on Lip20 showed TChol 189, TG 142, HDL 49, LDL 112  GRAVE'S DISEASE (ZOX-096.04) - treated w/ I-131 in 2004... HYPOTHYROIDISM (ICD-244.9) - on SYNTHROID - taking 1/2 tab daily ~  Labs 1/13 on Synthroid50 showed TSH= 2.96  GASTROESOPHAGEAL REFLUX DISEASE (ICD-530.81) - on ZANTAC 150mg  Bid Prn for reflux symptoms. ~  EGD by [facility] 8/10 showed a Schatzki ring, mod HH, otherw neg...  COLONIC POLYPS (ICD-211.3) - prev followed by [facility] for GI> she had hx of ischemic colitis in past... ~  colonoscopy 8/10 showed scattered divertics, 1 diminutive polyp= hyperplastic. ~  recall +FamHx Lynch syndrome w/ +genetic markers in Mother, Brother, Sister- all of whom have had colon cancers; Pat's genetic markers were neg at [facility]...  DYSFUNCTION of the SPHINCTER OF ODI >> she is now followed for GI by DrJue out of [place] in the [place] office. ~  MRI Abd 9/10 showed mild biliary dil- related to prev cholecystectomy, pancreas divisum, otherw neg. ~  7/13:  She tells me that DrJue diagnosed dysfunction in the sphincter of Odi as an explanation for her RUQ abd discomfort; she says he did not rec surg & just to deal w/ the pain offering her pain meds or pain consult if it got worse...  OSTEOPOROSIS (ICD-733.00) - on RECLAST infusions yearly...  R/O GENETIC SUSCEPTIBILITY OTHER MALIGNANT NEOPLASM (ICD-V84.09) - there is a family hx of [facility]  Syndrome & she has been evaluated at the [facility]> she was negative for the MLH1 mutation previously identified in members of her family...   Past Surgical History  Procedure Date  . Appendectomy 1960  . Cholecystectomy 1960  . Tonsillectomy 1941  . Cataract extraction 2002    Outpatient Encounter Prescriptions as of 07/30/2011  Medication Sig Dispense Refill  . aspirin 81 MG tablet Take 81 mg by mouth daily.        Marland Kitchen atorvastatin (LIPITOR) 20 MG tablet Take 1 tablet (20 mg total) by mouth daily.  90 tablet  3  . calcium carbonate (TUMS - DOSED IN MG ELEMENTAL CALCIUM) 500 MG chewable tablet Chew 2 tablets by mouth 3 (three) times daily as needed.        Marland Kitchen  cholecalciferol (VITAMIN D) 1000 UNITS tablet Take 2,000 Units by mouth daily.       . Fluticasone-Salmeterol (ADVAIR DISKUS) 250-50 MCG/DOSE AEPB Inhale 1 puff into the lungs every 12 (twelve) hours.  120 each  4  . levothyroxine (SYNTHROID, LEVOTHROID) 112 MCG tablet TAKE 1 TABLET BY MOUTH EVERY DAY  90 tablet  1  . omeprazole (PRILOSEC) 40 MG capsule Take 40 mg by mouth daily.      Marland Kitchen tiotropium (SPIRIVA) 18 MCG inhalation capsule Place 1 capsule (18 mcg total) into inhaler and inhale daily.  90 capsule  4  . zoledronic acid (RECLAST) 5 MG/100ML SOLN Inject 100 mLs (5 mg total) into the vein once.  100 mL  0  . DISCONTD: omeprazole (PRILOSEC) 40 MG capsule Take 1 capsule (40 mg total) by mouth 2 (two) times daily.  180 capsule  3  . DISCONTD: Alum & Mag Hydroxide-Simeth (MAGIC MOUTHWASH) SOLN Take 5 mLs by mouth. 1 tsp gargle and swallow up to four times daily as needed         Allergies  Allergen Reactions  . Codeine     REACTION: passes out  . Niacin     REACTION: Sever Flushing  . Penicillins     Rash all over  . Sulfonamide Derivatives     REACTION: Nausea    Current Medications, Allergies, Past Medical History, Past Surgical History, Family History, and Social History were reviewed in [employer] record.    Review of Systems        See HPI - all other systems neg except as noted...   The patient complains of dyspnea on exertion.  The patient denies anorexia, fever, weight loss, weight gain, vision loss, decreased hearing, hoarseness, chest pain, syncope, peripheral edema, prolonged cough, headaches, hemoptysis, abdominal pain, melena, hematochezia, severe indigestion/heartburn, hematuria, incontinence, muscle weakness, suspicious skin lesions, transient blindness, difficulty walking, depression, unusual weight change, abnormal bleeding, enlarged lymph nodes, and angioedema.     Objective:   Physical Exam     WD, WN, 78 y/o WF in NAD... GENERAL:  Alert & oriented; pleasant & cooperative... HEENT:  [place]/AT, EOM-wnl, PERRLA, EACs-clear, TMs-wnl, NOSE-clear, THROAT-clear & wnl. NECK:  Supple w/ fairROM; no JVD; normal carotid impulses w/o bruits; no thyromegaly or nodules palpated; no lymphadenopathy. CHEST:  Clear to P & A w/ decr BS bilat- without wheezes/ rales/ or rhonchi heard... HEART:  Regular Rhythm; without murmurs/ rubs/ or gallops detected... ABDOMEN:  Soft & nontender; normal bowel sounds; no organomegaly or masses palpated... EXT: without deformities or arthritic changes; no varicose veins/ venous insuffic/ or edema. NEURO:  CN's intact; motor testing normal; sensory testing normal; gait normal & balance OK. DERM:  No lesions noted; no rash etc...  RADIOLOGY DATA:  Reviewed in the EPIC EMR & discussed w/ the patient...    >>CXR & PFTs resulted above...  LABORATORY DATA:  Reviewed in the EPIC EMR & discussed w/ the patient...   Assessment & Plan:   COPD>  Much improved on regular dosing of her ADVAIR 250 Bid & SPIRIVA daily; continue same, f/u 50yr sooner prn...  Borderline MVP/ MR>  Prev 2DEcho 2006 & eval DrWall; she is essent asymptomatic, doing well...  Hyperlipidemia>  On Lipitor 20mg /d & labs followed by Leland Her, last FLP 11/11 w/ LDL 127 & she  is reminded to take med daily & low chol, low fat diet...  Hx Grave's Dis/ S/P I-131 Rx/ subseq Hypothyroidism>  Stable on Synthroid 168mcg/d.Marland KitchenMarland Kitchen  GERD>  On Prilosec 40mg /d per DrYoo & improved...  Colon Polyps>  Followed by [facility] & she reports that she is up to date...  Osteoporosis>  She is due for her Reclast infusion but DrYoo is out; we offered to set this up for her but she prefers to wait until 9/12 & she will let us know if we can be of assist to her...  Other medical issues as noted...   Patient's Medications  New Prescriptions   No medications on file  Previous Medications   ASPIRIN 81 MG TABLET    Take 81 mg by mouth daily.     ATORVASTATIN (LIPITOR) 20 MG TABLET    Take 1 tablet (20 mg total) by mouth daily.   CALCIUM CARBONATE (TUMS - DOSED IN MG ELEMENTAL CALCIUM) 500 MG CHEWABLE TABLET    Chew 2 tablets by mouth 3 (three) times daily as needed.     CHOLECALCIFEROL (VITAMIN D) 1000 UNITS TABLET    Take 2,000 Units by mouth daily.    LEVOTHYROXINE (SYNTHROID, LEVOTHROID) 112 MCG TABLET    TAKE 1 TABLET BY MOUTH EVERY DAY   ZOLEDRONIC ACID (RECLAST) 5 MG/100ML SOLN    Inject 100 mLs (5 mg total) into the vein once.  Modified Medications   Modified Medication Previous Medication   FLUTICASONE-SALMETEROL (ADVAIR DISKUS) 250-50 MCG/DOSE AEPB Fluticasone-Salmeterol (ADVAIR DISKUS) 250-50 MCG/DOSE AEPB      Inhale 1 puff into the lungs every 12 (twelve) hours.    Inhale 1 puff into the lungs every 12 (twelve) hours.   OMEPRAZOLE (PRILOSEC) 40 MG CAPSULE omeprazole (PRILOSEC) 40 MG capsule      Take 40 mg by mouth daily.    Take 1 capsule (40 mg total) by mouth 2 (two) times daily.   TIOTROPIUM (SPIRIVA) 18 MCG INHALATION CAPSULE tiotropium (SPIRIVA) 18 MCG inhalation capsule      Place 1 capsule (18 mcg total) into inhaler and inhale daily.    Place 1 capsule (18 mcg total) into inhaler and inhale daily.  Discontinued Medications   ALUM & MAG HYDROXIDE-SIMETH (MAGIC  MOUTHWASH) SOLN    Take 5 mLs by mouth. 1 tsp gargle and swallow up to four times daily as needed

## 2011-08-07 ENCOUNTER — Ambulatory Visit: Payer: Medicare Other | Admitting: Family

## 2011-08-28 ENCOUNTER — Ambulatory Visit (INDEPENDENT_AMBULATORY_CARE_PROVIDER_SITE_OTHER): Payer: Medicare Other | Admitting: Family

## 2011-08-28 ENCOUNTER — Encounter: Payer: Self-pay | Admitting: Family

## 2011-08-28 ENCOUNTER — Ambulatory Visit (HOSPITAL_BASED_OUTPATIENT_CLINIC_OR_DEPARTMENT_OTHER)
Admission: RE | Admit: 2011-08-28 | Discharge: 2011-08-28 | Disposition: A | Discharge status: Home / Self Care | Payer: Medicare Other | Source: Ambulatory Visit | Attending: Family | Admitting: Family

## 2011-08-28 VITALS — BP 124/82 | HR 67 | Temp 97.9°F | Resp 18 | Ht 67.5 in | Wt 175.1 lb

## 2011-08-28 DIAGNOSIS — M810 Age-related osteoporosis without current pathological fracture: Secondary | ICD-10-CM

## 2011-08-28 DIAGNOSIS — R071 Chest pain on breathing: Secondary | ICD-10-CM | POA: Insufficient documentation

## 2011-08-28 DIAGNOSIS — E039 Hypothyroidism, unspecified: Secondary | ICD-10-CM

## 2011-08-28 DIAGNOSIS — S20219A Contusion of unspecified front wall of thorax, initial encounter: Secondary | ICD-10-CM

## 2011-08-28 DIAGNOSIS — R0789 Other chest pain: Secondary | ICD-10-CM

## 2011-08-28 DIAGNOSIS — I700 Atherosclerosis of aorta: Secondary | ICD-10-CM | POA: Insufficient documentation

## 2011-08-28 DIAGNOSIS — Z8639 Personal history of other endocrine, nutritional and metabolic disease: Secondary | ICD-10-CM

## 2011-08-28 NOTE — Patient Instructions (Addendum)
Please complete your labs prior to leaving. Complete your lab x-ray on the first floor.  Follow up in 3 months. You will be contact about your referral for Reclast. Please let us know if you have not heard back within 1 week about your referral.

## 2011-08-28 NOTE — Progress Notes (Signed)
Subjective:    Patient ID: Amanda Boyer, female    DOB: December 18, 1932, 78 y.o.   MRN: [id number]  HPI  Amanda Boyer is a 78 yr old female who presents today for follow up.  Osteopenia- needs reclast prior to 9/8.    Fall-  Accidental fall, tripped 2 weeks ago.  Fell on her left side.  She has anterior rib pain.  Using ibuprofen.    She reports that she scheduled an apt with digestive specialists in [place] and saw Dr. Cristal Deer June who diagnosed her with Sphincter of Odi disfunction.   Previously seeing Dr. Kinnie Scales.     Review of Systems See HPI  Past Medical History  Diagnosis Date  . Dyspnea on exertion   . COPD (chronic obstructive pulmonary disease)   . Hyperlipidemia   . Thyroid disease     hypothyroidism  . GERD (gastroesophageal reflux disease)   . Osteoporosis   . History of tobacco abuse   . Grave's disease   . Colon polyps     History   Social History  . Marital Status: Widowed    Spouse Name: N/A    Number of Children: 1  . Years of Education: N/A   Occupational History  . RETIRED     works part time as Artist   Social History Main Topics  . Smoking status: Former Smoker    Types: Cigarettes    Quit date: 01/22/2001  . Smokeless tobacco: Never Used  . Alcohol Use: Yes     rare  . Drug Use: No  . Sexually Active: Not on file   Other Topics Concern  . Not on file   Social History Narrative   Lives alone. Works part time as Artist. Originally from [place]. Previous to living in [place], she was in [place], [place].    Past Surgical History  Procedure Date  . Appendectomy 1960  . Cholecystectomy 1960  . Tonsillectomy 1941  . Cataract extraction 2002    Family History  Problem Relation Age of Onset  . Arthritis Mother     rheumatoid  . Heart disease Brother     s/p stents  . Cancer Other     multiple siblings with colon cancer    Allergies  Allergen Reactions  . Codeine     REACTION: passes out  .  Niacin     REACTION: Sever Flushing  . Penicillins     Rash all over  . Sulfonamide Derivatives     REACTION: Nausea    Current Outpatient Prescriptions on File Prior to Visit  Medication Sig Dispense Refill  . aspirin 81 MG tablet Take 81 mg by mouth daily.        Marland Kitchen atorvastatin (LIPITOR) 20 MG tablet Take 1 tablet (20 mg total) by mouth daily.  90 tablet  3  . calcium carbonate (TUMS - DOSED IN MG ELEMENTAL CALCIUM) 500 MG chewable tablet Chew 2 tablets by mouth 3 (three) times daily as needed.        . cholecalciferol (VITAMIN D) 1000 UNITS tablet Take 2,000 Units by mouth daily.       . Fluticasone-Salmeterol (ADVAIR DISKUS) 250-50 MCG/DOSE AEPB Inhale 1 puff into the lungs every 12 (twelve) hours.  180 each  4  . levothyroxine (SYNTHROID, LEVOTHROID) 112 MCG tablet TAKE 1 TABLET BY MOUTH EVERY DAY  90 tablet  1  . omeprazole (PRILOSEC) 40 MG capsule Take 40 mg by mouth daily.      Marland Kitchen  tiotropium (SPIRIVA) 18 MCG inhalation capsule Place 1 capsule (18 mcg total) into inhaler and inhale daily.  90 capsule  4  . zoledronic acid (RECLAST) 5 MG/100ML SOLN Inject 100 mLs (5 mg total) into the vein once.  100 mL  0    BP 124/82  Pulse 67  Temp 97.9 F (36.6 C) (Oral)  Resp 18  Ht 5' 7.5" (1.715 m)  Wt 175 lb 1.9 oz (79.434 kg)  BMI 27.02 kg/m2  SpO2 99%       Objective:   Physical Exam  Constitutional: She appears well-developed and well-nourished. No distress.  Cardiovascular: Normal rate and regular rhythm.   No murmur heard. Pulmonary/Chest: Effort normal and breath sounds normal. No respiratory distress. She has no wheezes. She has no rales. She exhibits no tenderness.  Musculoskeletal: She exhibits no edema.       Left anterior chest wall tenderness to palpation without bruising or swelling.  Psychiatric: She has a normal mood and affect. Her speech is normal and behavior is normal. Thought content normal.          Assessment & Plan:

## 2011-08-29 LAB — VITAMIN D 25 HYDROXY (VIT D DEFICIENCY, FRACTURES): Vit D, 25-Hydroxy: 48 ng/mL (ref 30–89)

## 2011-08-29 LAB — BASIC METABOLIC PANEL
Calcium: 9.9 mg/dL (ref 8.4–10.5)
Creat: 0.98 mg/dL (ref 0.50–1.10)

## 2011-09-02 NOTE — Assessment & Plan Note (Addendum)
Clinically stable. Obtain TSH, continue levothyroxine.  

## 2011-09-02 NOTE — Assessment & Plan Note (Signed)
Obtain vitamin D level, schedule annual reclast infusion, continue caltrate, tums, vitamin D.

## 2011-09-02 NOTE — Assessment & Plan Note (Signed)
X-ray obtained to rule out rib fracture- negative for fracture.

## 2011-09-04 ENCOUNTER — Telehealth: Payer: Self-pay | Admitting: *Deleted

## 2011-09-04 ENCOUNTER — Other Ambulatory Visit: Payer: Self-pay | Admitting: Family

## 2011-09-04 DIAGNOSIS — M810 Age-related osteoporosis without current pathological fracture: Secondary | ICD-10-CM

## 2011-09-04 NOTE — Telephone Encounter (Signed)
Message copied by Kathi Simpers on Tue Sep 04, 2011 10:03 AM ------      Message from: O'SULLIVAN, MELISSA      Created: Mon Sep 03, 2011  2:45 PM       Patient needs reclast infusion scheduled prior to 9/8 please.

## 2011-09-07 NOTE — Telephone Encounter (Signed)
Reclast has been arranged for 09/12/11 at [facility] at 1pm. Pt has been notified.

## 2011-09-11 ENCOUNTER — Other Ambulatory Visit (HOSPITAL_COMMUNITY): Payer: Self-pay | Admitting: *Deleted

## 2011-09-12 ENCOUNTER — Encounter (HOSPITAL_COMMUNITY)
Admission: RE | Admit: 2011-09-12 | Discharge: 2011-09-12 | Disposition: A | Discharge status: Home / Self Care | Payer: Medicare Other | Source: Ambulatory Visit | Attending: Family | Admitting: Family

## 2011-09-12 ENCOUNTER — Encounter (HOSPITAL_COMMUNITY): Payer: Self-pay

## 2011-09-12 VITALS — BP 130/70 | HR 61 | Temp 97.5°F | Resp 18 | Ht 67.5 in | Wt 175.0 lb

## 2011-09-12 DIAGNOSIS — M810 Age-related osteoporosis without current pathological fracture: Secondary | ICD-10-CM

## 2011-09-12 MED ORDER — ZOLEDRONIC ACID 5 MG/100ML IV SOLN
5.0000 mg | Freq: Once | INTRAVENOUS | Status: AC
  Administered 2011-09-12: 5 mg via INTRAVENOUS
  Filled 2011-09-12: qty 100

## 2011-09-12 MED ORDER — SODIUM CHLORIDE 0.9 % IV SOLN
Freq: Once | INTRAVENOUS | Status: AC
  Administered 2011-09-12: 250 mL via INTRAVENOUS

## 2011-09-28 ENCOUNTER — Encounter: Payer: Self-pay | Admitting: Family

## 2011-09-28 ENCOUNTER — Ambulatory Visit (INDEPENDENT_AMBULATORY_CARE_PROVIDER_SITE_OTHER): Payer: Medicare Other | Admitting: Family

## 2011-09-28 VITALS — BP 116/80 | HR 81 | Temp 98.1°F | Resp 16 | Wt 175.0 lb

## 2011-09-28 DIAGNOSIS — L538 Other specified erythematous conditions: Secondary | ICD-10-CM

## 2011-09-28 DIAGNOSIS — L304 Erythema intertrigo: Secondary | ICD-10-CM | POA: Insufficient documentation

## 2011-09-28 MED ORDER — NYSTATIN 100000 UNIT/GM EX POWD: Freq: Four times a day (QID) | CUTANEOUS | Status: DC

## 2011-09-28 NOTE — Assessment & Plan Note (Signed)
Will rx with nystatin powder. Pt is instructed to call if symptom worsen or if no improvement in 1 week.

## 2011-09-28 NOTE — Patient Instructions (Addendum)
Intertrigo Intertrigo is a skin condition that occurs in between folds of skin in places on the body that rub together a lot and do not get much ventilation. It is caused by heat, moisture, friction, sweat retention, and lack of air circulation, which produces red, irritated patches and, sometimes, scaling or drainage. People who have diabetes, who are obese, or who have treatment with antibiotics are at increased risk for intertrigo. The most common sites for intertrigo to occur include:  The groin.   The breasts.   The armpits.   Folds of abdominal skin.   Webbed spaces between the fingers or toes.  Intertrigo may be aggravated by:  Sweat.   Feces.   Yeast or bacteria that are present near skin folds.   Urine.   Vaginal discharge.  HOME CARE INSTRUCTIONS  The following steps can be taken to reduce friction and keep the affected area cool and dry:   Expose skin folds to the air.   Keep deep skin folds separated with cotton or linen cloth. Avoid tight fitting clothing that could cause chafing.   Wear open-toed shoes or sandals to help reduce moisture between the toes.   Apply absorbent powders to affected areas as directed by your caregiver.   Apply over-the-counter barrier pastes, such as zinc oxide, as directed by your caregiver.   If you develop a fungal infection in the affected area, your caregiver may have you use antifungal creams.  SEEK MEDICAL CARE IF:   The rash is not improving after 1 week of treatment.   The rash is getting worse (more red, more swollen, more painful, or spreading).   You have a fever or chills.  MAKE SURE YOU:   Understand these instructions.   Will watch your condition.   Will get help right away if you are not doing well or get worse.  Document Released: 01/08/2005 Document Revised: 12/28/2010 Document Reviewed: 06/23/2009 ExitCare Patient Information 2012 [employer]. 

## 2011-09-28 NOTE — Progress Notes (Signed)
  Subjective:    Patient ID: Amanda Boyer, female    DOB: 1932/07/25, 78 y.o.   MRN: [id number]  HPI  Pt reports rash under right breast x 1 week. Rash is itching and red and is not improved by hydrocortisone.  Rash is worsening.   Review of Systems     Objective:   Physical Exam  Constitutional: She appears well-developed and well-nourished.  Skin:       Red, moist round patch of skin beneath the right breast.   Psychiatric: She has a normal mood and affect. Her behavior is normal. Judgment and thought content normal.          Assessment & Plan:

## 2011-11-08 ENCOUNTER — Telehealth: Payer: Self-pay | Admitting: *Deleted

## 2011-11-08 NOTE — Telephone Encounter (Signed)
Pt called reporting she wants to change her choice of pharmacy. She wants to make a change to [facility] and for [place] [facility]. Change has been made in demographics.

## 2011-11-27 ENCOUNTER — Ambulatory Visit (INDEPENDENT_AMBULATORY_CARE_PROVIDER_SITE_OTHER): Payer: Medicare Other | Admitting: Family

## 2011-11-27 ENCOUNTER — Ambulatory Visit: Payer: Medicare Other | Admitting: Internal Medicine

## 2011-11-27 VITALS — BP 100/70 | HR 74 | Temp 98.1°F | Resp 16 | Ht 67.5 in | Wt 174.0 lb

## 2011-11-27 DIAGNOSIS — L304 Erythema intertrigo: Secondary | ICD-10-CM

## 2011-11-27 DIAGNOSIS — Z23 Encounter for immunization: Secondary | ICD-10-CM

## 2011-11-27 DIAGNOSIS — L538 Other specified erythematous conditions: Secondary | ICD-10-CM

## 2011-11-27 DIAGNOSIS — E785 Hyperlipidemia, unspecified: Secondary | ICD-10-CM

## 2011-11-27 DIAGNOSIS — E039 Hypothyroidism, unspecified: Secondary | ICD-10-CM

## 2011-11-27 LAB — LIPID PANEL
Cholesterol: 198 mg/dL (ref 0–200)
HDL: 47 mg/dL (ref >39)
Total CHOL/HDL Ratio: 4.2 Ratio

## 2011-11-27 LAB — HEPATIC FUNCTION PANEL
AST: 15 U/L (ref 0–37)
Albumin: 4.3 g/dL (ref 3.5–5.2)
Total Bilirubin: 0.5 mg/dL (ref 0.3–1.2)

## 2011-11-27 MED ORDER — LEVOTHYROXINE SODIUM 112 MCG PO TABS: 112.0000 ug | ORAL_TABLET | Freq: Every day | ORAL | Status: DC

## 2011-11-27 MED ORDER — ATORVASTATIN CALCIUM 20 MG PO TABS: 20.0000 mg | ORAL_TABLET | Freq: Every day | ORAL | Status: DC

## 2011-11-27 MED ORDER — FLUCONAZOLE 150 MG PO TABS: ORAL_TABLET | ORAL | Status: DC

## 2011-11-27 MED ORDER — NYSTATIN 100000 UNIT/GM EX POWD: Freq: Four times a day (QID) | CUTANEOUS | Status: DC

## 2011-11-27 NOTE — Patient Instructions (Addendum)
Please complete your blood work prior to leaving.  Please follow up in 6 months, sooner if problems/concerns.

## 2011-11-27 NOTE — Assessment & Plan Note (Signed)
Obtain FLP, continue  Statin.

## 2011-11-27 NOTE — Assessment & Plan Note (Signed)
Continue antifungal powder, add diflucan.

## 2011-11-27 NOTE — Assessment & Plan Note (Signed)
Clinically stable on synthroid, obtain tsh. 

## 2011-11-27 NOTE — Progress Notes (Signed)
Subjective:    Patient ID: Amanda Boyer, female    DOB: [birth year], 78 y.o.   MRN: [id number]  HPI  Amanda Boyer is a 78 yr old female who presents today for follow up.  1) Hypothyroid- reports feeling well on her current dose of synthroid.   2) Hyperlipidemia- she continues lipitor without problems.  Reports that she is fasting today.   3) Rash- beneath left breast x 1 month.      Review of Systems    see HPI  Past Medical History  Diagnosis Date  . Dyspnea on exertion   . COPD (chronic obstructive pulmonary disease)   . Hyperlipidemia   . Thyroid disease     hypothyroidism  . GERD (gastroesophageal reflux disease)   . Osteoporosis   . History of tobacco abuse   . Grave's disease   . Colon polyps     History   Social History  . Marital Status: Widowed    Spouse Name: N/A    Number of Children: 1  . Years of Education: N/A   Occupational History  . RETIRED     works part time as Artist   Social History Main Topics  . Smoking status: Former Smoker    Types: Cigarettes    Quit date: 01/22/2001  . Smokeless tobacco: Never Used  . Alcohol Use: Yes     Comment: rare  . Drug Use: No  . Sexually Active: Not on file   Other Topics Concern  . Not on file   Social History Narrative   Lives alone. Works part time as Artist. Originally from [place]. Previous to living in [place], she was in [place], [place].    Past Surgical History  Procedure Date  . Appendectomy 1960  . Cholecystectomy 1960  . Tonsillectomy 1941  . Cataract extraction 2002    Family History  Problem Relation Age of Onset  . Arthritis Mother     rheumatoid  . Heart disease Brother     s/p stents  . Cancer Other     multiple siblings with colon cancer    Allergies  Allergen Reactions  . Codeine     REACTION: passes out  . Niacin     REACTION: Sever Flushing  . Penicillins     Rash all over  . Sulfonamide Derivatives     REACTION: Nausea     Current Outpatient Prescriptions on File Prior to Visit  Medication Sig Dispense Refill  . aspirin 81 MG tablet Take 81 mg by mouth daily.        . calcium carbonate (TUMS - DOSED IN MG ELEMENTAL CALCIUM) 500 MG chewable tablet Chew 2 tablets by mouth 3 (three) times daily as needed.        . cholecalciferol (VITAMIN D) 1000 UNITS tablet Take 2,000 Units by mouth daily.       . Fluticasone-Salmeterol (ADVAIR DISKUS) 250-50 MCG/DOSE AEPB Inhale 1 puff into the lungs every 12 (twelve) hours.  180 each  4  . omeprazole (PRILOSEC) 40 MG capsule Take 40 mg by mouth daily.      Marland Kitchen tiotropium (SPIRIVA) 18 MCG inhalation capsule Place 1 capsule (18 mcg total) into inhaler and inhale daily.  90 capsule  4  . zoledronic acid (RECLAST) 5 MG/100ML SOLN Inject 100 mLs (5 mg total) into the vein once.  100 mL  0  . [DISCONTINUED] atorvastatin (LIPITOR) 20 MG tablet Take 1 tablet (20 mg total) by mouth daily.  90  tablet  3  . [DISCONTINUED] levothyroxine (SYNTHROID, LEVOTHROID) 112 MCG tablet TAKE 1 TABLET BY MOUTH EVERY DAY  90 tablet  1    BP 100/70  Pulse 74  Temp 98.1 F (36.7 C) (Oral)  Resp 16  Ht 5' 7.5" (1.715 m)  Wt 174 lb (78.926 kg)  BMI 26.85 kg/m2  SpO2 99%    Objective:   Physical Exam  Constitutional: She is oriented to person, place, and time. She appears well-developed and well-nourished. No distress.  Cardiovascular: Normal rate and regular rhythm.   No murmur heard. Pulmonary/Chest: Effort normal and breath sounds normal. No respiratory distress. She has no wheezes. She has no rales. She exhibits no tenderness.  Musculoskeletal: She exhibits no edema.  Lymphadenopathy:    She has no cervical adenopathy.  Neurological: She is alert and oriented to person, place, and time.  Skin:       Red, erythematous rash beneath left breast.  Psychiatric: She has a normal mood and affect. Her behavior is normal. Judgment and thought content normal.          Assessment & Plan:

## 2011-11-28 ENCOUNTER — Encounter: Payer: Self-pay | Admitting: Family

## 2011-12-14 ENCOUNTER — Other Ambulatory Visit: Payer: Self-pay | Admitting: Family

## 2011-12-17 NOTE — Telephone Encounter (Signed)
[facility]. Our records indicate recent rx sent to [facility] on 11/27/11 #90 x 1 refill. Spoke with pt and confirmed that pt is not requesting a refill at this time and is no [facility]. Denial sent to pharmacy. [facility] will [facility] tomorrow to try and stop automatic refill requests.

## 2012-01-07 ENCOUNTER — Other Ambulatory Visit: Payer: Self-pay | Admitting: *Deleted

## 2012-01-07 ENCOUNTER — Other Ambulatory Visit: Payer: Self-pay | Admitting: Family

## 2012-01-07 MED ORDER — NYSTATIN 100000 UNIT/GM EX POWD: Freq: Two times a day (BID) | CUTANEOUS | Status: DC

## 2012-01-07 NOTE — Telephone Encounter (Signed)
[facility]

## 2012-01-07 NOTE — Telephone Encounter (Signed)
Received call from pt requesting a refill of her fungal medication. States that her rash has not completely resolved and she will be going out of town for [employer]. Pt would like a supply to take with her. Pt spelled medication on voicemail as (Nyamyc?). Left detailed message on pt's voicemail to call to verify name of medication requested.

## 2012-01-07 NOTE — Telephone Encounter (Signed)
Left message on voicemail re: Rx completion and to call if any questions.

## 2012-01-07 NOTE — Telephone Encounter (Signed)
[facility]atin [facility] per [facility]nerd at it was sent to them in Error. Rx sent to [facility].

## 2012-01-07 NOTE — Telephone Encounter (Signed)
Pt called back stating she would like refill of Nystatin powder.  Please advise.

## 2012-03-04 ENCOUNTER — Ambulatory Visit (INDEPENDENT_AMBULATORY_CARE_PROVIDER_SITE_OTHER): Payer: Medicare Other | Admitting: Pulmonary Disease

## 2012-03-04 ENCOUNTER — Encounter: Payer: Self-pay | Admitting: Pulmonary Disease

## 2012-03-04 VITALS — BP 128/62 | HR 87 | Temp 97.7°F | Ht 68.0 in | Wt 173.8 lb

## 2012-03-04 DIAGNOSIS — D126 Benign neoplasm of colon, unspecified: Secondary | ICD-10-CM

## 2012-03-04 DIAGNOSIS — E785 Hyperlipidemia, unspecified: Secondary | ICD-10-CM

## 2012-03-04 DIAGNOSIS — M810 Age-related osteoporosis without current pathological fracture: Secondary | ICD-10-CM

## 2012-03-04 DIAGNOSIS — E039 Hypothyroidism, unspecified: Secondary | ICD-10-CM

## 2012-03-04 DIAGNOSIS — J449 Chronic obstructive pulmonary disease, unspecified: Secondary | ICD-10-CM

## 2012-03-04 DIAGNOSIS — K219 Gastro-esophageal reflux disease without esophagitis: Secondary | ICD-10-CM

## 2012-03-04 NOTE — Progress Notes (Signed)
Subjective:    Patient ID: Amanda Boyer, female    DOB: 08-13-1932, 79 y.o.   MRN: [id number]  HPI 79 y/o WF, who used to work for [employer] in the 90's, self referred 10/11 for complaints of DOE... she is an ex-smoker, no prev COPD diagnosis, no hx recurrent infections, etc... she has hx Hyperlipidemia, hx Grave's dis- s/p I-131 Rx w/ subseq hypothyroidism on Synthroid, hx GERD, hx colon polyps w/ FamHx of Lynch syndrome, and Osteoporosis (SEE BELOW)> followed by DrYoo for primary care...  ~  November 10, 2009:  presents w/ CC of DOE x 22yr- noted w/ yard work, housework, etc... gradual onset & not really progressive by her hx but appt request precipitated by episode of SOB several weeks ago while in her car... she notes SOB= hard to get the air out, denies any cough or phlegm, no hemoptysis, no wheezing heard, no CP or palpit, no change in swelling etc... she has not had any recent infection... she is an ex-smoker having started in her 60's & smoked up to 2ppd for 40 yrs, quitting in 2003 at age 22 (she quit then because of SOB)... her last CXR was 9/06 and showed tort Ao, clear lungs, NAD... she has not had prev PFTs ==> eval included CXR (hyperinflation, attenuated markings, clear/ NAD); and PFTs (mod airflow obstruction w/ FEV1= 1.10liters); started on Advair & Spiriva.  ~  January 03, 2010:  she notes considerable improvement in her breathing & denies dyspnea at present... she feels the Advair/ Spiriva has really helped & she can walk farther, work longer, etc> all w/o DOE now... still denies CP, palpit, cough, phlegm, edema, etc... notes intermittent throat irritation & she is reminded to rinse after each Rx & we gave her a perscription for MMW for Prn use... we will hold off on repeat spirometry today... we discussed her immuniz status> she gets the yearly Flu vaccine (had it 10/11);  had PNEUMOVAX in 2001 at age 48, so she needs another post-65 vaccination & we gave that to her today;  had  Tetanus shot in 2002 in [facility] after cutting her foot on glass...  she just ret from trip to [place], and is heading out to [facility] to see her brother.  ~  August 07, 2010:  3mo ROV & she has had several trips visiting relatives & did quite well on her current meds, denies cough, sputum, SOB, chest discomfort, etc; needs refills for 90d supplies... She was concerned about not getting her Reclast infusion yet this yr as Leland Her is out on leave & we volunteered to set this up for her but she declined preferring to wait until Sep12 after all her trips are done... She is due for a 57yr Tetanus vaccination & we gave her the TDAP today...  ~  January 29, 2011:  39mo ROV & pulmonary follow up> she is an ex-smoker quit 11/03, states her breathing is good, notes sl SOB w/ the wet, cold weather;  She is on Advair & Spiriva & takes them regularly, tolerated well;  Shew denies cough, sputum, SOB (notes DOE w/o change), CP, & notes improved stamina etc...  CXR remains clear, NAD; and PFT shows a fabulous improvement in her FEV1...  ~  July 30, 2011:  39mo ROV & Dennie Bible has been doing well, no new complaints or concerns;  She continues to follow w/ Sandford Craze at the [facility] office for Primary Care & had a Medicare Physical 1/13... Her breathing remains good on  Advair & Spiriva...    We reviewed prob list, meds, xrays and labs> see below for updates>>  ~  March 04, 2012:  769mo ROV & Dennie Bible tells me that she was ill about 69mo ago when visiting relatives in [place]- Adm w/ COPD exac from RSV viral infection;  She developed what she thought was the flu (she had the Flu vax from her primary doctor last fall) w/ dry cough, wheezing, SOB, & weakness to the point that she couldn't walk;  Taken to the ER at [facility] in [place] & saw Pulmonologist DrMagcalas;  She was hosp for 5d & disch on our same meds + Levaquin, Prednisone, & VentolinHFA prn;  She brought some records home w/ her & CXR- clear, no pneumonia; Viral panel was neg x RSV  pos...  She is now off the Levaquin & Pred- feeling back to baseline & ready to resume her travels...     We reviewed prob list, meds, xrays and labs> see below for updates >> she is followed for primary care by Sandford Craze at the [facility] office...           Problem List:      CHRONIC OBSTRUCTIVE PULMONARY DISEASE (ICD-496), DYSPNEA ON EXERTION (ICD-786.09), Hx of TOBACCO ABUSE (ICD-305.1) >> on ADVAIR 250- 2spBid & SPIRIVA daily. ~  ex-smoker> started in her 66's & smoked up to 2ppd for 40 yrs, quitting in 2003 at age 35. ~  CXR 10/11 showed COPD, tortuous Ao, NAD... ~  PFTs 10/11 showed FVC= 1.70 (51%), FEV1= 1.10 (44%), %1sec= 65, mid-flows= 32%pred. ~  7/12:  she reports improved breathing since starting Advair & Spiriva 10/11... ~  CXR 1/13 showed COPD, clear lungs, NAD... ~  PFT 1/13 showed FVC=2.41 (74%), FE#V1=1.70 (71%), %1sec=71, mid-flows=53%predicted... ~  1/14:  She was [facility] [place] x5d w/ a COPD exac caused by RSV virus found on viral panel=> full recovery back to baseline.  Hx of CARDIAC EVAL in 2005 by DrWall- borderline MVP> ~  Treadmill test 7/05 by DrWall did not reveal any ischemia, but there was an exaggerated BP response (up to 200/84 in stage I). ~  Myoview 8/05 showed mild breast attenuation, no ischemia or infarct, EF=65%... ~  2DEcho 9/06 showed mild MR w/ borderline prolapse, otherw normal... ~  Art Dopplers 10/07 showed normal ABIs & waveforms...  HYPERLIPIDEMIA (ICD-272.4) - on LIPITOR 20mg /d... ~  FLP 1/13 on Lip20 showed TChol 189, TG 142, HDL 49, LDL 112 ~  FLP 11/13 on Lip20 showed TChol 198, TG 118, HDL 47, LDL 127... Her primary kept her on the Lip20.  GRAVE'S DISEASE (ICD-242.00) - treated w/ I-131 in 2004... HYPOTHYROIDISM (ICD-244.9) - on SYNTHROID - taking 1/2 tab daily ~  Labs 1/13 on Synthroid50 showed TSH= 2.96 ~  Labs 11/13 on Synthroid50 showed TSH= 2.11  GASTROESOPHAGEAL REFLUX DISEASE (ICD-530.81) - on ZANTAC 150mg  Bid Prn  for reflux symptoms. ~  EGD by [facility] 8/10 showed a Schatzki ring, mod HH, otherw neg...  COLONIC POLYPS (ICD-211.3) - prev followed by [facility] for GI> she had hx of ischemic colitis in past... ~  colonoscopy 8/10 showed scattered divertics, 1 diminutive polyp= hyperplastic. ~  recall +FamHx Lynch syndrome w/ +genetic markers in Mother, Brother, Sister- all of whom have had colon cancers; Pat's genetic markers were neg at [facility]...  DYSFUNCTION of the SPHINCTER OF ODI >> she is now followed for GI by DrJue out of [place] in the [place] office. ~  MRI  Abd 9/10 showed mild biliary dil- related to prev cholecystectomy, pancreas divisum, otherw neg. ~  7/13:  She tells me that DrJue diagnosed dysfunction in the sphincter of Odi as an explanation for her RUQ abd discomfort; she says he did not rec surg & just to deal w/ the pain offering her pain meds or pain consult if it got worse...  OSTEOPOROSIS (ICD-733.00) - on RECLAST infusions yearly...  R/O GENETIC SUSCEPTIBILITY OTHER MALIGNANT NEOPLASM (ICD-V84.09) - there is a family hx of [facility] Syndrome & she has been evaluated at the [facility]> she was negative for the MLH1 mutation previously identified in members of her family...   Past Surgical History  Procedure Laterality Date  . Appendectomy  1960  . Cholecystectomy  1960  . Tonsillectomy  1941  . Cataract extraction  2002    Outpatient Encounter Prescriptions as of 03/04/2012  Medication Sig Dispense Refill  . albuterol (VENTOLIN HFA) 108 (90 BASE) MCG/ACT inhaler Inhale 2 puffs into the lungs every 6 (six) hours as needed for wheezing.      Marland Kitchen aspirin 81 MG tablet Take 81 mg by mouth daily.        Marland Kitchen atorvastatin (LIPITOR) 20 MG tablet Take 1 tablet (20 mg total) by mouth daily.  90 tablet  3  . calcium carbonate (TUMS - DOSED IN MG ELEMENTAL CALCIUM) 500 MG chewable tablet Chew 2 tablets by mouth 3 (three) times daily as needed.        . cholecalciferol (VITAMIN D) 1000  UNITS tablet Take 2,000 Units by mouth daily.       . Fluticasone-Salmeterol (ADVAIR DISKUS) 250-50 MCG/DOSE AEPB Inhale 1 puff into the lungs every 12 (twelve) hours.  180 each  4  . levothyroxine (SYNTHROID, LEVOTHROID) 112 MCG tablet Take 1 tablet (112 mcg total) by mouth daily.  90 tablet  1  . loratadine (CLARITIN) 10 MG tablet Take 10 mg by mouth daily.      Marland Kitchen omeprazole (PRILOSEC) 40 MG capsule Take 40 mg by mouth daily.      Marland Kitchen tiotropium (SPIRIVA) 18 MCG inhalation capsule Place 1 capsule (18 mcg total) into inhaler and inhale daily.  90 capsule  4  . zoledronic acid (RECLAST) 5 MG/100ML SOLN Inject 100 mLs (5 mg total) into the vein once.  100 mL  0  . [DISCONTINUED] fluconazole (DIFLUCAN) 150 MG tablet Take one tablet by mouth today. Repeat in 1 week as needed for rash.  2 tablet  0  . [DISCONTINUED] nystatin (MYCOSTATIN) powder APPLY TOPICALLY 4 (FOUR) TIMES DAILY.  30 g  0  . [DISCONTINUED] nystatin (MYCOSTATIN) powder Apply topically 2 (two) times daily.  30 g  0   No facility-administered encounter medications on file as of 03/04/2012.    Allergies  Allergen Reactions  . Codeine     REACTION: passes out  . Niacin     REACTION: Sever Flushing  . Penicillins     Rash all over  . Sulfonamide Derivatives     REACTION: Nausea    Current Medications, Allergies, Past Medical History, Past Surgical History, Family History, and Social History were reviewed in [employer] record.    Review of Systems        See HPI - all other systems neg except as noted...   The patient complains of dyspnea on exertion.  The patient denies anorexia, fever, weight loss, weight gain, vision loss, decreased hearing, hoarseness, chest pain, syncope, peripheral edema, prolonged cough,  headaches, hemoptysis, abdominal pain, melena, hematochezia, severe indigestion/heartburn, hematuria, incontinence, muscle weakness, suspicious skin lesions, transient blindness, difficulty walking,  depression, unusual weight change, abnormal bleeding, enlarged lymph nodes, and angioedema.     Objective:   Physical Exam     WD, WN, 79 y/o WF in NAD... GENERAL:  Alert & oriented; pleasant & cooperative... HEENT:  [place]/AT, EOM-wnl, PERRLA, EACs-clear, TMs-wnl, NOSE-clear, THROAT-clear & wnl. NECK:  Supple w/ fairROM; no JVD; normal carotid impulses w/o bruits; no thyromegaly or nodules palpated; no lymphadenopathy. CHEST:  Clear to P & A w/ decr BS bilat- without wheezes/ rales/ or rhonchi heard... HEART:  Regular Rhythm; without murmurs/ rubs/ or gallops detected... ABDOMEN:  Soft & nontender; normal bowel sounds; no organomegaly or masses palpated... EXT: without deformities or arthritic changes; no varicose veins/ venous insuffic/ or edema. NEURO:  CN's intact; motor testing normal; sensory testing normal; gait normal & balance OK. DERM:  No lesions noted; no rash etc...  RADIOLOGY DATA:  Reviewed in the EPIC EMR & discussed w/ the patient...    >>CXR & PFTs resulted above...  LABORATORY DATA:  Reviewed in the EPIC EMR & discussed w/ the patient...   Assessment & Plan:    COPD>  Stable on regular dosing of her ADVAIR 250 Bid & SPIRIVA daily; now has VentolinHFA for prn use...continue same, f/u 43yr sooner prn...  Borderline MVP/ MR>  Prev 2DEcho 2006 & eval DrWall; she is essent asymptomatic, doing well...  Hyperlipidemia>  On Lipitor 20mg /d & labs followed by Carlis Stable al, last FLP 11/13 w/ LDL 127 & she may need incr dose...  Hx Grave's Dis/ S/P I-131 Rx/ subseq Hypothyroidism>  Stable on Synthroid - 1/2 daily...  GERD>  On Prilosec 40mg /d per DrYoo & improved...  Colon Polyps>  Prev followed by [id number] & now sees GI in [place].  Osteoporosis>  On Reclast infusion yearly from her primary physician..  Other medical issues as noted...   Patient's Medications  New Prescriptions   No medications on file  Previous Medications   ALBUTEROL (VENTOLIN HFA) 108 (90  BASE) MCG/ACT INHALER    Inhale 2 puffs into the lungs every 6 (six) hours as needed for wheezing.   ASPIRIN 81 MG TABLET    Take 81 mg by mouth daily.     ATORVASTATIN (LIPITOR) 20 MG TABLET    Take 1 tablet (20 mg total) by mouth daily.   CALCIUM CARBONATE (TUMS - DOSED IN MG ELEMENTAL CALCIUM) 500 MG CHEWABLE TABLET    Chew 2 tablets by mouth 3 (three) times daily as needed.     CHOLECALCIFEROL (VITAMIN D) 1000 UNITS TABLET    Take 2,000 Units by mouth daily.    FLUTICASONE-SALMETEROL (ADVAIR DISKUS) 250-50 MCG/DOSE AEPB    Inhale 1 puff into the lungs every 12 (twelve) hours.   LEVOTHYROXINE (SYNTHROID, LEVOTHROID) 112 MCG TABLET    Take 1 tablet (112 mcg total) by mouth daily.   LORATADINE (CLARITIN) 10 MG TABLET    Take 10 mg by mouth daily.   OMEPRAZOLE (PRILOSEC) 40 MG CAPSULE    Take 40 mg by mouth daily.   TIOTROPIUM (SPIRIVA) 18 MCG INHALATION CAPSULE    Place 1 capsule (18 mcg total) into inhaler and inhale daily.   ZOLEDRONIC ACID (RECLAST) 5 MG/100ML SOLN    Inject 100 mLs (5 mg total) into the vein once.  Modified Medications   No medications on file  Discontinued Medications   FLUCONAZOLE (DIFLUCAN) 150 MG TABLET  Take one tablet by mouth today. Repeat in 1 week as needed for rash.   NYSTATIN (MYCOSTATIN) POWDER    APPLY TOPICALLY 4 (FOUR) TIMES DAILY.   NYSTATIN (MYCOSTATIN) POWDER    Apply topically 2 (two) times daily.

## 2012-03-04 NOTE — Patient Instructions (Addendum)
Today we updated your med list in our EPIC system...    Continue your current medications the same...  We reviewed the data from your 1/14 Hosp in [place] w/ a COPD exacerbation...  Call for any problems...  Let's plan a follow up visit in 6 months.Marland KitchenMarland Kitchen

## 2012-05-28 ENCOUNTER — Ambulatory Visit (INDEPENDENT_AMBULATORY_CARE_PROVIDER_SITE_OTHER): Payer: Medicare Other | Admitting: Family

## 2012-05-28 ENCOUNTER — Encounter: Payer: Self-pay | Admitting: Family

## 2012-05-28 VITALS — BP 110/80 | HR 65 | Temp 97.8°F | Resp 16 | Ht 67.5 in | Wt 171.1 lb

## 2012-05-28 DIAGNOSIS — E785 Hyperlipidemia, unspecified: Secondary | ICD-10-CM

## 2012-05-28 DIAGNOSIS — R739 Hyperglycemia, unspecified: Secondary | ICD-10-CM

## 2012-05-28 DIAGNOSIS — E039 Hypothyroidism, unspecified: Secondary | ICD-10-CM

## 2012-05-28 DIAGNOSIS — R7309 Other abnormal glucose: Secondary | ICD-10-CM

## 2012-05-28 LAB — HEMOGLOBIN A1C: Mean Plasma Glucose: 117 mg/dL — ABNORMAL HIGH (ref <117)

## 2012-05-28 LAB — TSH: TSH: 1.134 u[IU]/mL (ref 0.350–4.500)

## 2012-05-28 MED ORDER — LEVOTHYROXINE SODIUM 112 MCG PO TABS: 112.0000 ug | ORAL_TABLET | Freq: Every day | ORAL | Status: DC

## 2012-05-28 MED ORDER — HYDROCORTISONE 2.5 % EX CREA: 1.0000 "application " | TOPICAL_CREAM | Freq: Two times a day (BID) | CUTANEOUS | Status: DC

## 2012-05-28 NOTE — Assessment & Plan Note (Signed)
Tolerating statin, check LFT, Lipids stable.

## 2012-05-28 NOTE — Patient Instructions (Addendum)
Please complete your lab work prior to leaving. Follow up in 6 months.  

## 2012-05-28 NOTE — Progress Notes (Signed)
Subjective:    Patient ID: Amanda Boyer, female    DOB: 1933/01/18, 79 y.o.   MRN: [id number]  HPI  Reports that she was hospitalized in January 1/6-1/10 due to respiratory illness in [facility].  Reports that she recovered without difficulty.   Hypothyroid- tolerating synthroid, tolerating dose.  Hyperlipidemia- on lipitor. Denies myalgia.    Review of Systems See HPI  Past Medical History  Diagnosis Date  . Dyspnea on exertion   . COPD (chronic obstructive pulmonary disease)   . Hyperlipidemia   . Thyroid disease     hypothyroidism  . GERD (gastroesophageal reflux disease)   . Osteoporosis   . History of tobacco abuse   . Grave's disease   . Colon polyps     History   Social History  . Marital Status: Widowed    Spouse Name: N/A    Number of Children: 1  . Years of Education: N/A   Occupational History  . RETIRED     works part time as Artist   Social History Main Topics  . Smoking status: Former Smoker    Types: Cigarettes    Quit date: 01/22/2001  . Smokeless tobacco: Never Used  . Alcohol Use: Yes     Comment: rare  . Drug Use: No  . Sexually Active: Not on file   Other Topics Concern  . Not on file   Social History Narrative   Lives alone. Works part time as Artist. Originally from [place]. Previous to living in [place], she was in [place], [place].    Past Surgical History  Procedure Laterality Date  . Appendectomy  1960  . Cholecystectomy  1960  . Tonsillectomy  1941  . Cataract extraction  2002    Family History  Problem Relation Age of Onset  . Arthritis Mother     rheumatoid  . Heart disease Brother     s/p stents  . Cancer Other     multiple siblings with colon cancer    Allergies  Allergen Reactions  . Codeine     REACTION: passes out  . Niacin     REACTION: Sever Flushing  . Penicillins     Rash all over  . Sulfonamide Derivatives     REACTION: Nausea    Current Outpatient  Prescriptions on File Prior to Visit  Medication Sig Dispense Refill  . albuterol (VENTOLIN HFA) 108 (90 BASE) MCG/ACT inhaler Inhale 2 puffs into the lungs every 6 (six) hours as needed for wheezing.      Marland Kitchen aspirin 81 MG tablet Take 81 mg by mouth daily.        Marland Kitchen atorvastatin (LIPITOR) 20 MG tablet Take 1 tablet (20 mg total) by mouth daily.  90 tablet  3  . calcium carbonate (TUMS - DOSED IN MG ELEMENTAL CALCIUM) 500 MG chewable tablet Chew 2 tablets by mouth 3 (three) times daily as needed.        . cholecalciferol (VITAMIN D) 1000 UNITS tablet Take 2,000 Units by mouth daily.       . Fluticasone-Salmeterol (ADVAIR DISKUS) 250-50 MCG/DOSE AEPB Inhale 1 puff into the lungs every 12 (twelve) hours.  180 each  4  . omeprazole (PRILOSEC) 40 MG capsule Take 40 mg by mouth daily.      Marland Kitchen tiotropium (SPIRIVA) 18 MCG inhalation capsule Place 1 capsule (18 mcg total) into inhaler and inhale daily.  90 capsule  4  . zoledronic acid (RECLAST) 5 MG/100ML SOLN Inject 100 mLs (  5 mg total) into the vein once.  100 mL  0   No current facility-administered medications on file prior to visit.    BP 110/80  Pulse 65  Temp(Src) 97.8 F (36.6 C) (Oral)  Resp 16  Ht 5' 7.5" (1.715 m)  Wt 171 lb 1.9 oz (77.62 kg)  BMI 26.39 kg/m2  SpO2 95%       Objective:   Physical Exam  Constitutional: She is oriented to person, place, and time. She appears well-developed and well-nourished. No distress.  HENT:  Head: Normocephalic and atraumatic.  Cardiovascular: Normal rate and regular rhythm.   No murmur heard. Pulmonary/Chest: Effort normal and breath sounds normal. No respiratory distress. She has no wheezes. She has no rales. She exhibits no tenderness.  Abdominal: Soft. Bowel sounds are normal.  Neurological: She is alert and oriented to person, place, and time.  Psychiatric: She has a normal mood and affect. Her behavior is normal. Judgment and thought content normal.          Assessment & Plan:

## 2012-05-28 NOTE — Assessment & Plan Note (Signed)
Check TSH, continue synthroid. 

## 2012-09-02 ENCOUNTER — Ambulatory Visit: Payer: Medicare Other | Admitting: Pulmonary Disease

## 2012-09-30 ENCOUNTER — Encounter: Payer: Self-pay | Admitting: Pulmonary Disease

## 2012-09-30 ENCOUNTER — Ambulatory Visit (INDEPENDENT_AMBULATORY_CARE_PROVIDER_SITE_OTHER): Payer: Medicare Other | Admitting: Pulmonary Disease

## 2012-09-30 VITALS — BP 120/64 | HR 71 | Temp 97.5°F | Ht 68.0 in | Wt 176.0 lb

## 2012-09-30 DIAGNOSIS — M810 Age-related osteoporosis without current pathological fracture: Secondary | ICD-10-CM

## 2012-09-30 DIAGNOSIS — Z23 Encounter for immunization: Secondary | ICD-10-CM

## 2012-09-30 DIAGNOSIS — D126 Benign neoplasm of colon, unspecified: Secondary | ICD-10-CM

## 2012-09-30 DIAGNOSIS — K219 Gastro-esophageal reflux disease without esophagitis: Secondary | ICD-10-CM

## 2012-09-30 DIAGNOSIS — J449 Chronic obstructive pulmonary disease, unspecified: Secondary | ICD-10-CM

## 2012-09-30 DIAGNOSIS — E039 Hypothyroidism, unspecified: Secondary | ICD-10-CM

## 2012-09-30 DIAGNOSIS — E785 Hyperlipidemia, unspecified: Secondary | ICD-10-CM

## 2012-09-30 MED ORDER — TIOTROPIUM BROMIDE MONOHYDRATE 18 MCG IN CAPS: 18.0000 ug | ORAL_CAPSULE | Freq: Every day | RESPIRATORY_TRACT | Status: DC

## 2012-09-30 MED ORDER — FLUTICASONE-SALMETEROL 250-50 MCG/DOSE IN AEPB: 1.0000 | INHALATION_SPRAY | Freq: Two times a day (BID) | RESPIRATORY_TRACT | Status: DC

## 2012-09-30 NOTE — Patient Instructions (Addendum)
Today we updated your med list in our EPIC system...    Continue your current medications the same...  We will try to fax your prescriptions to the [facility] per request...  We gave you the 2014 Flu vaccine today...  Call for any questions...  Let's plan a follow up visit in 6+mo, sooner if needed for problems.Marland KitchenMarland Kitchen

## 2012-09-30 NOTE — Progress Notes (Signed)
Subjective:    Patient ID: Amanda Boyer, female    DOB: 04/15/1932, 79 y.o.   MRN: [id number]  HPI 79 y/o WF, who used to work for [employer] in the 90's, self referred 10/11 for complaints of DOE... she is an ex-smoker, no prev COPD diagnosis, no hx recurrent infections, etc... she has hx Hyperlipidemia, hx Grave's dis- s/p I-131 Rx w/ subseq hypothyroidism on Synthroid, hx GERD, hx colon polyps w/ FamHx of Lynch syndrome, and Osteoporosis (SEE BELOW)> followed by DrYoo for primary care...  ~  November 10, 2009:  presents w/ CC of DOE x 65yr- noted w/ yard work, housework, etc... gradual onset & not really progressive by her hx but appt request precipitated by episode of SOB several weeks ago while in her car... she notes SOB= hard to get the air out, denies any cough or phlegm, no hemoptysis, no wheezing heard, no CP or palpit, no change in swelling etc... she has not had any recent infection... she is an ex-smoker having started in her 69's & smoked up to 2ppd for 40 yrs, quitting in 2003 at age 37 (she quit then because of SOB)... her last CXR was 9/06 and showed tort Ao, clear lungs, NAD... she has not had prev PFTs ==> eval included CXR (hyperinflation, attenuated markings, clear/ NAD); and PFTs (mod airflow obstruction w/ FEV1= 1.10liters); started on Advair & Spiriva.  ~  January 03, 2010:  she notes considerable improvement in her breathing & denies dyspnea at present... she feels the Advair/ Spiriva has really helped & she can walk farther, work longer, etc> all w/o DOE now... still denies CP, palpit, cough, phlegm, edema, etc... notes intermittent throat irritation & she is reminded to rinse after each Rx & we gave her a perscription for MMW for Prn use... we will hold off on repeat spirometry today... we discussed her immuniz status> she gets the yearly Flu vaccine (had it 10/11);  had PNEUMOVAX in 2001 at age 67, so she needs another post-65 vaccination & we gave that to her today;  had  Tetanus shot in 2002 in [place] [facility] after cutting her foot on glass...  she just ret from trip to [place], and is heading out to [facility] to see her brother.  ~  August 07, 2010:  47mo ROV & she has had several trips visiting relatives & did quite well on her current meds, denies cough, sputum, SOB, chest discomfort, etc; needs refills for 90d supplies... She was concerned about not getting her Reclast infusion yet this yr as Leland Her is out on leave & we volunteered to set this up for her but she declined preferring to wait until Sep12 after all her trips are done... She is due for a 39yr Tetanus vaccination & we gave her the TDAP today...  ~  January 29, 2011:  44mo ROV & pulmonary follow up> she is an ex-smoker quit 11/03, states her breathing is good, notes sl SOB w/ the wet, cold weather;  She is on Advair & Spiriva & takes them regularly, tolerated well;  Shew denies cough, sputum, SOB (notes DOE w/o change), CP, & notes improved stamina etc...  CXR remains clear, NAD; and PFT shows a fabulous improvement in her FEV1...  ~  July 30, 2011:  44mo ROV & Dennie Bible has been doing well, no new complaints or concerns;  She continues to follow w/ Sandford Craze at the [facility] office for Primary Care & had a Medicare Physical 1/13... Her breathing remains good on  Advair & Spiriva...    We reviewed prob list, meds, xrays and labs> see below for updates>>  ~  March 04, 2012:  60mo ROV & Dennie Bible tells me that she was ill about 59mo ago when visiting relatives in [place]- Adm w/ COPD exac from RSV viral infection;  She developed what she thought was the flu (she had the Flu vax from her primary doctor last fall) w/ dry cough, wheezing, SOB, & weakness to the point that she couldn't walk;  Taken to the ER at [facility] in [place] & saw Pulmonologist DrMagcalas;  She was hosp for 5d & disch on our same meds + Levaquin, Prednisone, & VentolinHFA prn;  She brought some records home w/ her & CXR- clear, no pneumonia; Viral panel was neg x RSV  pos...  She is now off the Levaquin & Pred- feeling back to baseline & ready to resume her travels...     We reviewed prob list, meds, xrays and labs> see below for updates >> she is followed for primary care by Sandford Craze at the [facility] office...  ~  September 30, 2012:  56mo ROV & Dennie Bible reports a good interval, no recurrent resp infections or breathing problems; she quit smoking >10 yrs ago, takes her Advair & Spiriva regularly, she is "able to run rings around" her younger relative & remains very active... Dennie Bible tells me that she will be moving back to [place] when she is able to sell her [place] home... We reviewed prob list, meds, xrays and labs> see below for updates >> OK 2014 Flu shot today...           Problem List:      CHRONIC OBSTRUCTIVE PULMONARY DISEASE (ICD-496), DYSPNEA ON EXERTION (ICD-786.09), Hx of TOBACCO ABUSE (ICD-305.1) >> on ADVAIR 250- 2spBid & SPIRIVA daily. ~  ex-smoker> started in her 39's & smoked up to 2ppd for 40 yrs, quitting in 2003 at age 79. ~  CXR 10/11 showed COPD, tortuous Ao, NAD... ~  PFTs 10/11 showed FVC= 1.70 (51%), FEV1= 1.10 (44%), %1sec= 65, mid-flows= 32%pred. ~  7/12:  she reports improved breathing since starting Advair & Spiriva 10/11... ~  CXR 1/13 showed COPD, clear lungs, NAD... ~  PFT 1/13 showed FVC=2.41 (74%), FE#V1=1.70 (71%), %1sec=71, mid-flows=53%predicted... ~  1/14:  She was [facility] in [place] x5d w/ a COPD exac caused by RSV virus found on viral panel=> full recovery back to baseline; CXR report 1/14 showed hyperinflation, norm heart size, clear lungs, NAD. ~  9/14:  She has been doing well on Adviar250-Bid & Spiriva daily; min cough (esp w/ laughing), no CP/ palpit/ dizzy etc; she has chr stable DOE w/o change & she is very active; exam w/ decr BS but clear w/o w/r/r...  Hx of CARDIAC EVAL in 2005 by DrWall- borderline MVP> ~  Treadmill test 7/05 by DrWall did not reveal any ischemia, but there was an exaggerated BP response (up to  200/84 in stage I). ~  Myoview 8/05 showed mild breast attenuation, no ischemia or infarct, EF=65%... ~  2DEcho 9/06 showed mild MR w/ borderline prolapse, otherw normal... ~  Art Dopplers 10/07 showed normal ABIs & waveforms... ~  She denies CP, palpit, dizzy, syncope, etc...  HYPERLIPIDEMIA (ICD-272.4) - on LIPITOR 20mg /d... ~  FLP 1/13 on Lip20 showed TChol 189, TG 142, HDL 49, LDL 112 ~  FLP 11/13 on Lip20 showed TChol 198, TG 118, HDL 47, LDL 127... Her primary kept her on  the Lip20.  GRAVE'S DISEASE (ICD-242.00) - treated w/ I-131 in 2004... HYPOTHYROIDISM (ICD-244.9) - on SYNTHROID in varying doses per primary... ?if pt is taking 1/2 tab?  ~  TSH values in 2011 and 2012 ranged from 0.54 to 3.39 all supposedly on 140mcg/d (but ?compliance?)... ~  Labs 12/12 showed TSH= 3.39 & Sandford Craze increased her Synthroid from 100mg /d to 130mcg/d... ~  Labs 1/13 on Synthroid112 showed TSH= 2.96 ~  Labs 11/13 on Synthroid112 showed TSH= 2.11 ~  Labs 5/14 on Synthroid112 showed TSH= 1.13  GASTROESOPHAGEAL REFLUX DISEASE (ICD-530.81) - on ZANTAC 150mg  Bid Prn for reflux symptoms. ~  EGD by [facility] 8/10 showed a Schatzki ring, mod HH, otherw neg...  COLONIC POLYPS (ICD-211.3) - prev followed by [facility] for GI> she had hx of ischemic colitis in past... ~  colonoscopy 8/10 showed scattered divertics, 1 diminutive polyp= hyperplastic. ~  recall +FamHx Lynch syndrome w/ +genetic markers in Mother, Brother, Sister- all of whom have had colon cancers; Pat's genetic markers were neg at [facility]...  DYSFUNCTION of the SPHINCTER OF ODI >> she is now followed for GI by DrJue out of [place] in the [place] office. ~  MRI Abd 9/10 showed mild biliary dil- related to prev cholecystectomy, pancreas divisum, otherw neg. ~  7/13:  She tells me that DrJue diagnosed dysfunction in the sphincter of Odi as an explanation for her RUQ abd discomfort; she says he did not rec surg & just to deal w/ the pain offering her  pain meds or pain consult if it got worse...  OSTEOPOROSIS (ICD-733.00) - on RECLAST infusions yearly...  R/O GENETIC SUSCEPTIBILITY OTHER MALIGNANT NEOPLASM (ICD-V84.09) - there is a family hx of [facility] Syndrome & she has been evaluated at the [facility]> she was negative for the MLH1 mutation previously identified in members of her family...   Past Surgical History  Procedure Laterality Date  . Appendectomy  1960  . Cholecystectomy  1960  . Tonsillectomy  1941  . Cataract extraction  2002    Outpatient Encounter Prescriptions as of 09/30/2012  Medication Sig Dispense Refill  . albuterol (VENTOLIN HFA) 108 (90 BASE) MCG/ACT inhaler Inhale 2 puffs into the lungs every 6 (six) hours as needed for wheezing.      Marland Kitchen aspirin 81 MG tablet Take 81 mg by mouth daily.        Marland Kitchen atorvastatin (LIPITOR) 20 MG tablet Take 1 tablet (20 mg total) by mouth daily.  90 tablet  3  . calcium carbonate (TUMS - DOSED IN MG ELEMENTAL CALCIUM) 500 MG chewable tablet Chew 2 tablets by mouth 3 (three) times daily as needed.        . cholecalciferol (VITAMIN D) 1000 UNITS tablet Take 2,000 Units by mouth daily.       . Fluticasone-Salmeterol (ADVAIR DISKUS) 250-50 MCG/DOSE AEPB Inhale 1 puff into the lungs every 12 (twelve) hours.  180 each  4  . hydrocortisone 2.5 % cream Apply 1 application topically 2 (two) times daily.  30 g  0  . levothyroxine (SYNTHROID, LEVOTHROID) 112 MCG tablet Take 1 tablet (112 mcg total) by mouth daily.  90 tablet  1  . omeprazole (PRILOSEC) 40 MG capsule Take 40 mg by mouth daily.      Marland Kitchen tiotropium (SPIRIVA) 18 MCG inhalation capsule Place 1 capsule (18 mcg total) into inhaler and inhale daily.  90 capsule  4  . zoledronic acid (RECLAST) 5 MG/100ML SOLN Inject 100 mLs (5 mg total) into the  vein once.  100 mL  0  . [DISCONTINUED] Fluticasone-Salmeterol (ADVAIR DISKUS) 250-50 MCG/DOSE AEPB Inhale 1 puff into the lungs every 12 (twelve) hours.  180 each  4  .  [DISCONTINUED] tiotropium (SPIRIVA) 18 MCG inhalation capsule Place 1 capsule (18 mcg total) into inhaler and inhale daily.  90 capsule  4   No facility-administered encounter medications on file as of 09/30/2012.    Allergies  Allergen Reactions  . Codeine     REACTION: passes out  . Niacin     REACTION: Sever Flushing  . Penicillins     Rash all over  . Sulfonamide Derivatives     REACTION: Nausea    Current Medications, Allergies, Past Medical History, Past Surgical History, Family History, and Social History were reviewed in [employer] record.    Review of Systems        See HPI - all other systems neg except as noted...   The patient complains of dyspnea on exertion.  The patient denies anorexia, fever, weight loss, weight gain, vision loss, decreased hearing, hoarseness, chest pain, syncope, peripheral edema, prolonged cough, headaches, hemoptysis, abdominal pain, melena, hematochezia, severe indigestion/heartburn, hematuria, incontinence, muscle weakness, suspicious skin lesions, transient blindness, difficulty walking, depression, unusual weight change, abnormal bleeding, enlarged lymph nodes, and angioedema.     Objective:   Physical Exam     WD, WN, 79 y/o WF in NAD... GENERAL:  Alert & oriented; pleasant & cooperative... HEENT:  /AT, EOM-wnl, PERRLA, EACs-clear, TMs-wnl, NOSE-clear, THROAT-clear & wnl. NECK:  Supple w/ fairROM; no JVD; normal carotid impulses w/o bruits; no thyromegaly or nodules palpated; no lymphadenopathy. CHEST:  Clear to P & A w/ decr BS bilat- without wheezes/ rales/ or rhonchi heard... HEART:  Regular Rhythm; without murmurs/ rubs/ or gallops detected... ABDOMEN:  Soft & nontender; normal bowel sounds; no organomegaly or masses palpated... EXT: without deformities or arthritic changes; no varicose veins/ venous insuffic/ or edema. NEURO:  CN's intact; motor testing normal; sensory testing normal; gait normal & balance  OK. DERM:  No lesions noted; no rash etc...  RADIOLOGY DATA:  Reviewed in the EPIC EMR & discussed w/ the patient...    >>CXR & PFTs resulted above...  LABORATORY DATA:  Reviewed in the EPIC EMR & discussed w/ the patient...   Assessment & Plan:    COPD>  Stable on regular dosing of her ADVAIR 250 Bid & SPIRIVA daily; now has VentolinHFA for prn use...continue same, f/u 83yr sooner prn...  Borderline MVP/ MR>  Prev 2DEcho 2006 & eval DrWall; she is essent asymptomatic, doing well...  Hyperlipidemia>  On Lipitor 20mg /d & labs followed by Carlis Stable al, last FLP 11/13 w/ LDL 127 & she may need incr dose...  Hx Grave's Dis/ S/P I-131 Rx/ subseq Hypothyroidism>  Stable on Synthroid112...  GERD>  On Prilosec 40mg /d per DrYoo & improved...  Colon Polyps>  Prev followed by [facility] & now sees GI in [place].  Osteoporosis>  On Reclast infusion yearly from her primary physician..  Other medical issues as noted...   Patient's Medications  New Prescriptions   No medications on file  Previous Medications   ALBUTEROL (VENTOLIN HFA) 108 (90 BASE) MCG/ACT INHALER    Inhale 2 puffs into the lungs every 6 (six) hours as needed for wheezing.   ASPIRIN 81 MG TABLET    Take 81 mg by mouth daily.     ATORVASTATIN (LIPITOR) 20 MG TABLET    Take 1 tablet (20 mg total)  by mouth daily.   CALCIUM CARBONATE (TUMS - DOSED IN MG ELEMENTAL CALCIUM) 500 MG CHEWABLE TABLET    Chew 2 tablets by mouth 3 (three) times daily as needed.     CHOLECALCIFEROL (VITAMIN D) 1000 UNITS TABLET    Take 2,000 Units by mouth daily.    HYDROCORTISONE 2.5 % CREAM    Apply 1 application topically 2 (two) times daily.   LEVOTHYROXINE (SYNTHROID, LEVOTHROID) 112 MCG TABLET    Take 1 tablet (112 mcg total) by mouth daily.   OMEPRAZOLE (PRILOSEC) 40 MG CAPSULE    Take 40 mg by mouth daily.   ZOLEDRONIC ACID (RECLAST) 5 MG/100ML SOLN    Inject 100 mLs (5 mg total) into the vein once.  Modified Medications   Modified Medication  Previous Medication   FLUTICASONE-SALMETEROL (ADVAIR DISKUS) 250-50 MCG/DOSE AEPB Fluticasone-Salmeterol (ADVAIR DISKUS) 250-50 MCG/DOSE AEPB      Inhale 1 puff into the lungs every 12 (twelve) hours.    Inhale 1 puff into the lungs every 12 (twelve) hours.   TIOTROPIUM (SPIRIVA) 18 MCG INHALATION CAPSULE tiotropium (SPIRIVA) 18 MCG inhalation capsule      Place 1 capsule (18 mcg total) into inhaler and inhale daily.    Place 1 capsule (18 mcg total) into inhaler and inhale daily.  Discontinued Medications   No medications on file

## 2012-12-02 ENCOUNTER — Encounter: Payer: Self-pay | Admitting: Family

## 2012-12-02 ENCOUNTER — Ambulatory Visit (INDEPENDENT_AMBULATORY_CARE_PROVIDER_SITE_OTHER): Payer: Medicare Other | Admitting: Family

## 2012-12-02 ENCOUNTER — Ambulatory Visit (HOSPITAL_BASED_OUTPATIENT_CLINIC_OR_DEPARTMENT_OTHER)
Admission: RE | Admit: 2012-12-02 | Discharge: 2012-12-02 | Disposition: A | Discharge status: Home / Self Care | Payer: Medicare Other | Source: Ambulatory Visit | Attending: Family | Admitting: Family

## 2012-12-02 VITALS — BP 112/70 | HR 70 | Temp 98.1°F | Resp 16 | Ht 67.5 in | Wt 174.1 lb

## 2012-12-02 DIAGNOSIS — R739 Hyperglycemia, unspecified: Secondary | ICD-10-CM

## 2012-12-02 DIAGNOSIS — M47817 Spondylosis without myelopathy or radiculopathy, lumbosacral region: Secondary | ICD-10-CM | POA: Insufficient documentation

## 2012-12-02 DIAGNOSIS — S335XXA Sprain of ligaments of lumbar spine, initial encounter: Secondary | ICD-10-CM

## 2012-12-02 DIAGNOSIS — M545 Low back pain, unspecified: Secondary | ICD-10-CM | POA: Insufficient documentation

## 2012-12-02 DIAGNOSIS — M549 Dorsalgia, unspecified: Secondary | ICD-10-CM | POA: Insufficient documentation

## 2012-12-02 DIAGNOSIS — R7309 Other abnormal glucose: Secondary | ICD-10-CM

## 2012-12-02 DIAGNOSIS — E039 Hypothyroidism, unspecified: Secondary | ICD-10-CM

## 2012-12-02 DIAGNOSIS — E785 Hyperlipidemia, unspecified: Secondary | ICD-10-CM

## 2012-12-02 DIAGNOSIS — S39012A Strain of muscle, fascia and tendon of lower back, initial encounter: Secondary | ICD-10-CM

## 2012-12-02 DIAGNOSIS — M810 Age-related osteoporosis without current pathological fracture: Secondary | ICD-10-CM | POA: Insufficient documentation

## 2012-12-02 DIAGNOSIS — J449 Chronic obstructive pulmonary disease, unspecified: Secondary | ICD-10-CM

## 2012-12-02 LAB — BASIC METABOLIC PANEL WITH GFR
GFR, Est African American: 62 mL/min
Glucose, Bld: 92 mg/dL (ref 70–99)
Potassium: 4.7 mEq/L (ref 3.5–5.3)
Sodium: 138 mEq/L (ref 135–145)

## 2012-12-02 LAB — HEMOGLOBIN A1C
Hgb A1c MFr Bld: 5.8 % — ABNORMAL HIGH (ref <5.7)
Mean Plasma Glucose: 120 mg/dL — ABNORMAL HIGH (ref <117)

## 2012-12-02 MED ORDER — ATORVASTATIN CALCIUM 20 MG PO TABS: 20.0000 mg | ORAL_TABLET | Freq: Every day | ORAL | Status: DC

## 2012-12-02 MED ORDER — MELOXICAM 7.5 MG PO TABS: 7.5000 mg | ORAL_TABLET | Freq: Every day | ORAL | Status: DC

## 2012-12-02 MED ORDER — LEVOTHYROXINE SODIUM 112 MCG PO TABS: 112.0000 ug | ORAL_TABLET | Freq: Every day | ORAL | Status: DC

## 2012-12-02 NOTE — Assessment & Plan Note (Signed)
Clinically stable on synthroid, check follow up TSH.

## 2012-12-02 NOTE — Progress Notes (Signed)
Subjective:    Patient ID: Amanda Boyer, female    DOB: 14-Sep-1932, 79 y.o.   MRN: [id number]  HPI  Amanda Boyer is a 79 yr old female who presents today for follow up.  1) Hypothyroid- Thyroid function normal last May, she continues levothyroxine .  Lab Results  Component Value Date   TSH 1.134 05/28/2012     2) Hyperlipidemia- Her last lipid panel was 1 year ago.   Lab Results  Component Value Date   CHOL 198 11/27/2011   HDL 47 11/27/2011   LDLCALC 127* 11/27/2011   LDLDIRECT 152.1 12/23/2007   TRIG 118 11/27/2011   CHOLHDL 4.2 11/27/2011     3) Low back pain-Recently moved. Has been doing some renovations on her home.  She is on reclast for osteoporosis.  4) COPD- maintained on advair, albuterol and spirirva.  She reports low back pain started 2 weeks ago. Ibuprofen HS to sleep.  Using ice- some improvement with  Ibuprofen and ice.    Review of Systems    see HPI  Past Medical History  Diagnosis Date  . Dyspnea on exertion   . COPD (chronic obstructive pulmonary disease)   . Hyperlipidemia   . Thyroid disease     hypothyroidism  . GERD (gastroesophageal reflux disease)   . Osteoporosis   . History of tobacco abuse   . Grave's disease   . Colon polyps     History   Social History  . Marital Status: Widowed    Spouse Name: N/A    Number of Children: 1  . Years of Education: N/A   Occupational History  . RETIRED     works part time as Artist   Social History Main Topics  . Smoking status: Former Smoker    Types: Cigarettes    Quit date: 01/22/2001  . Smokeless tobacco: Never Used  . Alcohol Use: Yes     Comment: rare  . Drug Use: No  . Sexual Activity: Not on file   Other Topics Concern  . 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 Previous to living in [place], she was in [place], [place].    Past Surgical History  Procedure Laterality  Date  . Appendectomy  1960  . Cholecystectomy  1960  . Tonsillectomy  1941  . Cataract extraction  2002    Family History  Problem Relation Age of Onset  . Arthritis Mother     rheumatoid  . Heart disease Brother     s/p stents  . Cancer Other     multiple siblings with colon cancer    Allergies  Allergen Reactions  . Codeine     REACTION: passes out  . Niacin     REACTION: Sever Flushing  . Penicillins     Rash all over  . Sulfonamide Derivatives     REACTION: Nausea    Current Outpatient Prescriptions on File Prior to Visit  Medication Sig Dispense Refill  . albuterol (VENTOLIN HFA) 108 (90 BASE) MCG/ACT inhaler Inhale 2 puffs into the lungs every 6 (six) hours as needed for wheezing.      Marland Kitchen aspirin 81 MG tablet Take 81 mg by mouth daily as needed.       . calcium carbonate (TUMS - DOSED IN MG ELEMENTAL CALCIUM) 500 MG chewable tablet Chew 2 tablets by mouth 3 (three) times daily as needed.        Marland Kitchen  cholecalciferol (VITAMIN D) 1000 UNITS tablet Take 2,000 Units by mouth daily.       . Fluticasone-Salmeterol (ADVAIR DISKUS) 250-50 MCG/DOSE AEPB Inhale 1 puff into the lungs every 12 (twelve) hours.  180 each  4  . omeprazole (PRILOSEC) 40 MG capsule Take 40 mg by mouth daily.      Marland Kitchen tiotropium (SPIRIVA) 18 MCG inhalation capsule Place 1 capsule (18 mcg total) into inhaler and inhale daily.  90 capsule  4  . zoledronic acid (RECLAST) 5 MG/100ML SOLN Inject 100 mLs (5 mg total) into the vein once.  100 mL  0   No current facility-administered medications on file prior to visit.    BP 112/70  Pulse 70  Temp(Src) 98.1 F (36.7 C) (Oral)  Resp 16  Ht 5' 7.5" (1.715 m)  Wt 174 lb 1.9 oz (78.98 kg)  BMI 26.85 kg/m2  SpO2 97%    Objective:   Physical Exam  Constitutional: She is oriented to person, place, and time. She appears well-developed and well-nourished. No distress.  HENT:  Head: Normocephalic and atraumatic.  Cardiovascular: Normal rate and regular rhythm.    No murmur heard. Pulmonary/Chest: Effort normal and breath sounds normal. No respiratory distress. She has no wheezes. She has no rales. She exhibits no tenderness.  Musculoskeletal: She exhibits no edema.       Lumbar back: She exhibits tenderness.  Neurological: She is alert and oriented to person, place, and time.  Psychiatric: She has a normal mood and affect. Her behavior is normal. Judgment and thought content normal.          Assessment & Plan:

## 2012-12-02 NOTE — Assessment & Plan Note (Signed)
Will rx with meloxicam.  X ray negative for compression fracture.

## 2012-12-02 NOTE — Assessment & Plan Note (Signed)
Lipid control fair last year, non-fasting. Check FLP next visit.

## 2012-12-02 NOTE — Patient Instructions (Signed)
Please complete lab work prior to leaving. Complete x ray on the first floor. Follow up in 1 month.

## 2012-12-02 NOTE — Assessment & Plan Note (Signed)
Stable on current meds. Up to date on flu shot.  Continue current meds.

## 2012-12-03 ENCOUNTER — Encounter: Payer: Self-pay | Admitting: Family

## 2012-12-03 LAB — TSH: TSH: 1.517 u[IU]/mL (ref 0.350–4.500)

## 2013-01-02 ENCOUNTER — Ambulatory Visit: Payer: Medicare Other | Admitting: Family

## 2013-02-25 ENCOUNTER — Telehealth: Payer: Self-pay | Admitting: Family

## 2013-02-25 DIAGNOSIS — M810 Age-related osteoporosis without current pathological fracture: Secondary | ICD-10-CM

## 2013-02-25 NOTE — Telephone Encounter (Signed)
Our records show her last reclast was 8/13.  If she has not received elsewhere and she wishes to continue reclast let me know and we can arrange infusion.

## 2013-02-25 NOTE — Telephone Encounter (Signed)
Left message on voicemail to return my call.  

## 2013-02-26 ENCOUNTER — Other Ambulatory Visit: Payer: Self-pay | Admitting: Family

## 2013-02-26 DIAGNOSIS — Z1231 Encounter for screening mammogram for malignant neoplasm of breast: Secondary | ICD-10-CM

## 2013-02-26 NOTE — Telephone Encounter (Signed)
Patient returned phone call and states that she has not been receiving reclast elsewhere and would like to continue.

## 2013-02-27 NOTE — Telephone Encounter (Signed)
Could you please initiate reclast infusion for Ms. Matty?

## 2013-03-04 ENCOUNTER — Ambulatory Visit (HOSPITAL_BASED_OUTPATIENT_CLINIC_OR_DEPARTMENT_OTHER)
Admission: RE | Admit: 2013-03-04 | Discharge: 2013-03-04 | Disposition: A | Discharge status: Home / Self Care | Payer: Medicare Other | Source: Ambulatory Visit | Attending: Family | Admitting: Family

## 2013-03-04 ENCOUNTER — Telehealth: Payer: Self-pay | Admitting: Family

## 2013-03-04 DIAGNOSIS — Z1231 Encounter for screening mammogram for malignant neoplasm of breast: Secondary | ICD-10-CM | POA: Insufficient documentation

## 2013-03-04 NOTE — Telephone Encounter (Signed)
She needs her reclast scheduled

## 2013-03-16 NOTE — Telephone Encounter (Signed)
See 02/25/13 phone note.

## 2013-03-18 ENCOUNTER — Other Ambulatory Visit: Payer: Self-pay | Admitting: Family

## 2013-03-18 ENCOUNTER — Other Ambulatory Visit (HOSPITAL_COMMUNITY): Payer: Self-pay | Admitting: Family

## 2013-03-18 DIAGNOSIS — M810 Age-related osteoporosis without current pathological fracture: Secondary | ICD-10-CM

## 2013-03-18 NOTE — Addendum Note (Signed)
Addended by: Kelle Darting A on: 03/18/2013 12:41 PM   Modules accepted: Orders

## 2013-03-18 NOTE — Telephone Encounter (Signed)
Initiated reclast order form and forwarded to Provider for signature. Pt will return 03/25/13 for lab work to be faxed with Reclast order.  Spoke with [facility] and she told me to use the paper form for the order and fax all completed labs / order to short stay and they will enter the order into EPIC. Will call to arrange appt after labs are completed. Pt states she cannot do 3/10 or 3/11 and does not want early morning appt.

## 2013-03-25 LAB — BASIC METABOLIC PANEL
BUN: 20 mg/dL (ref 6–23)
CO2: 28 mEq/L (ref 19–32)
CREATININE: 0.98 mg/dL (ref 0.50–1.10)
Calcium: 9.5 mg/dL (ref 8.4–10.5)
Chloride: 102 mEq/L (ref 96–112)
Glucose, Bld: 87 mg/dL (ref 70–99)
Potassium: 4.8 mEq/L (ref 3.5–5.3)
Sodium: 139 mEq/L (ref 135–145)

## 2013-03-27 NOTE — Telephone Encounter (Signed)
No authorization needed per [facility]. Reclast form and lab results faxed to [facility] short stay at [phone]. Awaiting return call from short stay to schedule appt.

## 2013-03-28 ENCOUNTER — Encounter: Payer: Self-pay | Admitting: Family

## 2013-03-31 ENCOUNTER — Ambulatory Visit (INDEPENDENT_AMBULATORY_CARE_PROVIDER_SITE_OTHER): Payer: Medicare Other | Admitting: Pulmonary Disease

## 2013-03-31 ENCOUNTER — Encounter: Payer: Self-pay | Admitting: Pulmonary Disease

## 2013-03-31 ENCOUNTER — Ambulatory Visit (INDEPENDENT_AMBULATORY_CARE_PROVIDER_SITE_OTHER)
Admission: RE | Admit: 2013-03-31 | Discharge: 2013-03-31 | Disposition: A | Discharge status: Home / Self Care | Payer: Medicare Other | Source: Ambulatory Visit | Attending: Pulmonary Disease | Admitting: Pulmonary Disease

## 2013-03-31 VITALS — BP 134/70 | HR 78 | Temp 97.8°F | Ht 68.0 in | Wt 176.0 lb

## 2013-03-31 DIAGNOSIS — R05 Cough: Secondary | ICD-10-CM

## 2013-03-31 DIAGNOSIS — J449 Chronic obstructive pulmonary disease, unspecified: Secondary | ICD-10-CM

## 2013-03-31 DIAGNOSIS — R059 Cough, unspecified: Secondary | ICD-10-CM

## 2013-03-31 DIAGNOSIS — K219 Gastro-esophageal reflux disease without esophagitis: Secondary | ICD-10-CM

## 2013-03-31 MED ORDER — METHYLPREDNISOLONE (PAK) 4 MG PO TABS: ORAL_TABLET | ORAL | Status: DC

## 2013-03-31 NOTE — Patient Instructions (Signed)
Today we updated your med list in our EPIC system...    Continue your current medications the same...  Today we did a follow up CXR & PFT...    We will contact you w/ the results when available...   We decided to treat your current symptoms w/ a Medrol dosepak over the next week...  Call for any questions...  Let's plan a follow up visit in 22mo, sooner if needed for problems.Marland KitchenMarland Kitchen

## 2013-03-31 NOTE — Progress Notes (Signed)
Subjective:    Patient ID: Amanda Boyer, female    DOB: 02/06/1932, 80 y.o.   MRN: [id number]  HPI 80 y/o WF, who used to work for [employer] in the 90's, self referred 10/11 for complaints of DOE... she is an ex-smoker, no prev COPD diagnosis, no hx recurrent infections, etc... she has hx Hyperlipidemia, hx Grave's dis- s/p I-131 Rx w/ subseq hypothyroidism on Synthroid, hx GERD, hx colon polyps w/ FamHx of Lynch syndrome, and Osteoporosis (SEE BELOW)> followed by DrYoo/ Melissa O'Sullivan for primary care...  ~  November 10, 2009:  presents w/ CC of DOE x 1yr- noted w/ yard work, housework, etc... gradual onset & not really progressive by her hx but appt request precipitated by episode of SOB several weeks ago while in her car... she notes SOB= hard to get the air out, denies any cough or phlegm, no hemoptysis, no wheezing heard, no CP or palpit, no change in swelling etc... she has not had any recent infection... she is an ex-smoker having started in her 20's & smoked up to 2ppd for 40 yrs, quitting in 2003 at age 69 (she quit then because of SOB)... her last CXR was 9/06 and showed tort Ao, clear lungs, NAD... she has not had prev PFTs ==> eval included CXR (hyperinflation, attenuated markings, clear/ NAD); and PFTs (mod airflow obstruction w/ FEV1= 1.10liters); started on Advair & Spiriva.  ~  January 03, 2010:  she notes considerable improvement in her breathing & denies dyspnea at present... she feels the Advair/ Spiriva has really helped & she can walk farther, work longer, etc> all w/o DOE now... still denies CP, palpit, cough, phlegm, edema, etc... notes intermittent throat irritation & she is reminded to rinse after each Rx & we gave her a perscription for MMW for Prn use... we will hold off on repeat spirometry today... we discussed her immuniz status> she gets the yearly Flu vaccine (had it 10/11);  had PNEUMOVAX in 2001 at age 66, so she needs another post-65 vaccination & we gave that to  her today;  had Tetanus shot in 2002 in [facility] after cutting her foot on glass...  she just ret from trip to [place], and is heading out to [place] to see her brother.  ~  August 07, 2010:  7mo ROV & she has had several trips visiting relatives & did quite well on her current meds, denies cough, sputum, SOB, chest discomfort, etc; needs refills for 90d supplies... She was concerned about not getting her Reclast infusion yet this yr as DrYoo is out on leave & we volunteered to set this up for her but she declined preferring to wait until Sep12 after all her trips are done... She is due for a 10yr Tetanus vaccination & we gave her the TDAP today...  ~  January 29, 2011:  6mo ROV & pulmonary follow up> she is an ex-smoker quit 11/03, states her breathing is good, notes sl SOB w/ the wet, cold weather;  She is on Advair & Spiriva & takes them regularly, tolerated well;  Shew denies cough, sputum, SOB (notes DOE w/o change), CP, & notes improved stamina etc...  CXR remains clear, NAD; and PFT shows a fabulous improvement in her FEV1...  ~  July 30, 2011:  6mo ROV & Pat has been doing well, no new complaints or concerns;  She continues to follow w/ Melissa O'Sullivan at the [facility] office for Primary Care & had a Medicare Physical 1/13... Her breathing remains   good on Advair & Spiriva...    We reviewed prob list, meds, xrays and labs> see below for updates>>  ~  March 04, 2012:  6mo ROV & Pat tells me that she was ill about 1mo ago when visiting relatives in [place]- [place] w/ COPD exac from RSV viral infection;  She developed what she thought was the flu (she had the Flu vax from her primary doctor last fall) w/ dry cough, wheezing, SOB, & weakness to the point that she couldn't walk;  Taken to the ER at [facility] in [place] & saw Pulmonologist DrMagcalas;  She was hosp for 5d & disch on our same meds + Levaquin, Prednisone, & VentolinHFA prn;  She brought some records home w/ her & CXR- clear, no pneumonia; Viral  panel was neg x RSV pos...  She is now off the Levaquin & Pred- feeling back to baseline & ready to resume her travels...     We reviewed prob list, meds, xrays and labs> see below for updates >> she is followed for primary care by Melissa O'Sullivan at the [facility] office...  ~  September 30, 2012:  7mo ROV & Pat reports a good interval, no recurrent resp infections or breathing problems; she quit smoking >10 yrs ago, takes her Advair & Spiriva regularly, she is "able to run rings around" her younger relative & remains very active... Pat tells me that she will be moving back to [place] when she is able to sell her [place] home... We reviewed prob list, meds, xrays and labs> see below for updates >> OK 2014 Flu shot today...  ~  March 31, 2013:  6mo ROV & pulmonary follow up> Pat reports that she is in the process of selling her condo & planning to move back to [place];  She has a dry cough, head congested, denies CP or SOB;  Exam shows stable VS/ afeb, decr breath sounds at bases w/ scat rhonchi but no consolidation;  She remains on Advair250Bid, spiriva daily, and uses an AlbuterolHFA inhaler as needed;  We decided to check CXR (COPD, clear, NAD) and PFT (fair effort- looks mildly restricted), and treat w/ Medrol dosepak, Mucinex, Delsym...  CXR 3/15 showed norm heart size, underlying COPD, clear lungs x some scarring at bases, NAD...  PFT 3/15 (fair effort) showed FVC=2.17 (68%), FEV1=1.60 (69%), %1sec=74, mid-flows= 67% predicted...            Problem List:    SHE IS FOLLOWED AT [facility] FOR PRIMARY CARE...  CHRONIC OBSTRUCTIVE PULMONARY DISEASE (ICD-496), DYSPNEA ON EXERTION (ICD-786.09), Hx of TOBACCO ABUSE (ICD-305.1) >> on ADVAIR 250- 2spBid & SPIRIVA daily. ~  ex-smoker> started in her 20's & smoked up to 2ppd for 40 yrs, quitting in 2003 at age 69. ~  CXR 10/11 showed COPD, tortuous Ao, NAD... ~  PFTs 10/11 showed FVC= 1.70 (51%), FEV1= 1.10 (44%), %1sec= 65,  mid-flows= 32%pred... c/w GOLD Stage3 COPD ~  7/12:  she reports improved breathing since starting Advair & Spiriva 10/11... ~  CXR 1/13 showed COPD, clear lungs, NAD... ~  PFT 1/13 showed FVC=2.41 (74%), FEV1=1.70 (71%), %1sec=71, mid-flows=53%predicted... Nice improvement from her baseline on Advair & Spiriva. ~  1/14:  She was Hosp in [place] x5d w/ a COPD exac caused by RSV virus found on viral panel=> full recovery back to baseline; CXR report 1/14 showed hyperinflation, norm heart size, clear lungs, NAD. ~  9/14:  She has been doing well on Adviar250-Bid &   Spiriva daily; min cough (esp w/ laughing), no CP/ palpit/ dizzy etc; she has chr stable DOE w/o change & she is very active; exam w/ decr BS but clear w/o w/r/r... ~  3/15: stable on Advair250 & Spiriva as noted; she developed URI w/ dry cough & congestion=> treated w/ Medrol Dosepak, Mucinex, Delsym... ~  CXR 3/15 showed norm heart size, underlying COPD, clear lungs x some scarring at bases, NAD... ~  PFT 3/15 (fair effort) showed FVC=2.17 (68%), FEV1=1.60 (69%), %1sec=74, mid-flows= 67% predicted...     Hx of CARDIAC EVAL in 2005 by DrWall- borderline MVP> ~  Treadmill test 7/05 by DrWall did not reveal any ischemia, but there was an exaggerated BP response (up to 200/84 in stage I). ~  Myoview 8/05 showed mild breast attenuation, no ischemia or infarct, EF=65%... ~  2DEcho 9/06 showed mild MR w/ borderline prolapse, otherw normal... ~  Art Dopplers 10/07 showed normal ABIs & waveforms... ~  She denies CP, palpit, dizzy, syncope, etc...  HYPERLIPIDEMIA (ICD-272.4) - on LIPITOR 65m/d... ~  FMystic1/13 on Lip20 showed TChol 189, TG 142, HDL 49, LDL 112 ~  FLP 11/13 on Lip20 showed TChol 198, TG 118, HDL 47, LDL 127... Her primary kept her on the [place]  GRAVE'S DISEASE (ICD-242.00) - treated w/ I-131 in 2004... HYPOTHYROIDISM (ICD-244.9) - on SYNTHROID in varying doses per primary... ?if pt is taking 1/2 tab?  ~  TSH values in 2011 and  2012 ranged from 0.54 to 3.39 all supposedly on 1083m/d (but ?compliance?)... ~  Labs 12/12 showed TSH= 3.39 & MeDebbrah Alarncreased her Synthroid from 10076m to 112m57m... ~  Labs 1/13 on Synthroid112 showed TSH= 2.96 ~  Labs 11/13 on Synthroid112 showed TSH= 2.11 ~  Labs 5/14 on Synthroid112 showed TSH= 1.13  GASTROESOPHAGEAL REFLUX DISEASE (ICD-530.81) - on ZANTAC 150mg73m Prn for reflux symptoms. ~  [facility]ki [facility], mod HH, otherw neg...  +FamHx Lynch syndrome w/ +genetic markers in Mother, Brother, Sister- all of whom have had colon cancers; Pat's genetic markers were neg at [facility]) - [facility]> she had hx of ischemic colitis in past... ~  Colonoscopy 8/10 showed scattered divertics, 1 diminutive polyp= hyperplastic.  DYSFUNCTION of the SPHINCTER OF ODI >> she is now followed for GI by [place]Jue [place]. ~  MRI Abd 9/10 showed mild biliary dil- related to prev cholecystectomy, pancreas divisum, otherw neg. ~  7/13:  She tells me that DrJue diagnosed dysfunction in the sphincter of Odi as an explanation for her RUQ abd discomfort; she says he did not rec surg & just to deal w/ the pain offering her pain meds or pain consult if it got worse...  OSTEOPOROSIS (ICD-733.00) - on RECLAST infusions yearly...  R/O GENETIC SUSCEPTIBILITY OTHER MALIGNANT NEOPLASM (ICD-V84.09) - there is a family hx of [facility] & [facility]> she was negative for the MLH1 mutation previously identified in members of her family...   Past Surgical History  Procedure Laterality Date  . Appendectomy  1960  . Cholecystectomy  1960  . Tonsillectomy  1941  . Cataract extraction  2002    Outpatient Encounter Prescriptions as of 03/31/2013  Medication Sig  . albuterol (VENTOLIN HFA) 108 (90 BASE) MCG/ACT inhaler Inhale 2 puffs into the lungs every 6 (six)  hours as needed for wheezing.  . aspMarland Kitchenrin 81 MG  tablet Take 81 mg by mouth daily as needed.   . atorvastatin (LIPITOR) 20 MG tablet Take 1 tablet (20 mg total) by mouth daily.  . calcium carbonate (TUMS - DOSED IN MG ELEMENTAL CALCIUM) 500 MG chewable tablet Chew 2 tablets by mouth 3 (three) times daily as needed.    . cholecalciferol (VITAMIN D) 1000 UNITS tablet Take 2,000 Units by mouth daily.   . Fluticasone-Salmeterol (ADVAIR DISKUS) 250-50 MCG/DOSE AEPB Inhale 1 puff into the lungs every 12 (twelve) hours.  . hydrocortisone 2.5 % cream Apply 1 application topically 2 (two) times daily as needed.  . levothyroxine (SYNTHROID, LEVOTHROID) 112 MCG tablet Take 1 tablet (112 mcg total) by mouth daily.  . meloxicam (MOBIC) 7.5 MG tablet Take 1 tablet (7.5 mg total) by mouth daily.  . omeprazole (PRILOSEC) 40 MG capsule Take 40 mg by mouth daily.  . tiotropium (SPIRIVA) 18 MCG inhalation capsule Place 1 capsule (18 mcg total) into inhaler and inhale daily.  . zoledronic acid (RECLAST) 5 MG/100ML SOLN Inject 100 mLs (5 mg total) into the vein once.    Allergies  Allergen Reactions  . Codeine     REACTION: passes out  . Niacin     REACTION: Sever Flushing  . Penicillins     Rash all over  . Sulfonamide Derivatives     REACTION: Nausea    Current Medications, Allergies, Past Medical History, Past Surgical History, Family History, and Social History were reviewed in [facility] electronic medical record.    Review of Systems        See HPI - all other systems neg except as noted...   The patient complains of dyspnea on exertion.  The patient denies anorexia, fever, weight loss, weight gain, vision loss, decreased hearing, hoarseness, chest pain, syncope, peripheral edema, prolonged cough, headaches, hemoptysis, abdominal pain, melena, hematochezia, severe indigestion/heartburn, hematuria, incontinence, muscle weakness, suspicious skin lesions, transient blindness, difficulty walking,  depression, unusual weight change, abnormal bleeding, enlarged lymph nodes, and angioedema.     Objective:   Physical Exam     WD, WN, 80 y/o WF in NAD... GENERAL:  Alert & oriented; pleasant & cooperative... HEENT:  [place]/AT, EOM-wnl, PERRLA, EACs-clear, TMs-wnl, NOSE-clear, THROAT-clear & wnl. NECK:  Supple w/ fairROM; no JVD; normal carotid impulses w/o bruits; no thyromegaly or nodules palpated; no lymphadenopathy. CHEST:  Clear to P & A w/ decr BS bilat- without wheezes/ rales/ or rhonchi heard... HEART:  Regular Rhythm; without murmurs/ rubs/ or gallops detected... ABDOMEN:  Soft & nontender; normal bowel sounds; no organomegaly or masses palpated... EXT: without deformities or arthritic changes; no varicose veins/ venous insuffic/ or edema. NEURO:  CN's intact; motor testing normal; sensory testing normal; gait normal & balance OK. DERM:  No lesions noted; no rash etc...  RADIOLOGY DATA:  Reviewed in the EPIC EMR & discussed w/ the patient...  LABORATORY DATA:  Reviewed in the EPIC EMR & discussed w/ the patient...   Assessment & Plan:    COPD>  Stable on regular dosing of her ADVAIR 250 Bid & SPIRIVA daily; now has VentolinHFA for prn use...continue same, f/u 1yr sooner prn... 3/15> mild URI w/ dry cough & congestion=> treated w/ Medrol dosepak, Muninex, Delsym...  Borderline MVP/ MR>  Prev 2DEcho 2006 & eval DrWall; she is essent asymptomatic, doing well...  Hyperlipidemia>  On Lipitor 20mg/d & labs followed by DrYoo et al, last FLP 11/13 w/ LDL 127 & she may need incr dose...  Hx Grave's Dis/ S/P I-131   Rx/ subseq Hypothyroidism>  Stable on Synthroid112...  GERD>  On Prilosec 70m/d per DrYoo & improved...  Colon Polyps>  Prev followed by [id number]& [place]  Osteoporosis>  On Reclast infusion yearly from her primary physician..  Other medical issues as noted..Marland KitchenMarland Kitchen

## 2013-03-31 NOTE — Telephone Encounter (Signed)
Left detailed message on pt's cell# re: appt date and time and to call if any questions.

## 2013-03-31 NOTE — Telephone Encounter (Signed)
Spoke with Amanda Boyer and scheduled Reclast for 04/15/13 at 11am. The nurse states they never received our order.  Refaxed order to [phone].

## 2013-03-31 NOTE — Telephone Encounter (Signed)
Refaxed Reclast orders to [facility] stay at [phone]. Will call back to schedule infusion once I receive confirmation of fax transmission as I am told I cannot schedule appt until they have order/labs in hand.

## 2013-04-01 ENCOUNTER — Telehealth: Payer: Self-pay | Admitting: Pulmonary Disease

## 2013-04-01 NOTE — Telephone Encounter (Signed)
Result Note     Please notify patient>     CXR shows norm heart size, COPD w/ some scarring at bases & NAD/stable    I spoke with patient about results and she verbalized understanding and had no questions

## 2013-04-01 NOTE — Telephone Encounter (Signed)
Pt returning call.Amanda Boyer ° °

## 2013-04-01 NOTE — Telephone Encounter (Signed)
Result Note     Please notify patient>     CXR shows norm heart size, COPD w/ some scarring at bases & NAD/stable...    LMTCB

## 2013-04-10 ENCOUNTER — Other Ambulatory Visit (HOSPITAL_COMMUNITY): Payer: Self-pay | Admitting: Family

## 2013-04-15 ENCOUNTER — Encounter (HOSPITAL_COMMUNITY): Payer: Self-pay

## 2013-04-15 ENCOUNTER — Ambulatory Visit (HOSPITAL_COMMUNITY)
Admission: RE | Admit: 2013-04-15 | Discharge: 2013-04-15 | Disposition: A | Discharge status: Home / Self Care | Payer: Medicare Other | Source: Ambulatory Visit | Attending: Family | Admitting: Family

## 2013-04-15 DIAGNOSIS — M810 Age-related osteoporosis without current pathological fracture: Secondary | ICD-10-CM | POA: Insufficient documentation

## 2013-04-15 MED ORDER — ZOLEDRONIC ACID 5 MG/100ML IV SOLN
5.0000 mg | Freq: Once | INTRAVENOUS | Status: AC
  Administered 2013-04-15: 5 mg via INTRAVENOUS
  Filled 2013-04-15: qty 100

## 2013-04-15 MED ORDER — SODIUM CHLORIDE 0.9 % IV SOLN
INTRAVENOUS | Status: DC
  Administered 2013-04-15: 12:00:00 via INTRAVENOUS

## 2013-04-15 NOTE — Discharge Instructions (Signed)
Drink  Fluids/water as tolerated over the next 72 hours °Tylenol or ibuprofen OTC as directed °Continue Calcium and Vit D as directed by your MD ° °Zoledronic Acid injection (Paget's Disease, Osteoporosis) °What is this medicine? °ZOLEDRONIC ACID (ZOE le dron ik AS id) lowers the amount of calcium loss from bone. It is used to treat Paget's disease and osteoporosis in women. °This medicine may be used for other purposes; ask your health care provider or pharmacist if you have questions. °COMMON BRAND NAME(S): Reclast, Zometa °What should I tell my health care provider before I take this medicine? °They need to know if you have any of these conditions: °-aspirin-sensitive asthma °-cancer, especially if you are receiving medicines used to treat cancer °-dental disease or wear dentures °-infection °-kidney disease °-low levels of calcium in the blood °-past surgery on the parathyroid gland or intestines °-receiving corticosteroids like dexamethasone or prednisone °-an unusual or allergic reaction to zoledronic acid, other medicines, foods, dyes, or preservatives °-pregnant or trying to get pregnant °-breast-feeding °How should I use this medicine? °This medicine is for infusion into a vein. It is given by a health care professional in a hospital or clinic setting. °Talk to your pediatrician regarding the use of this medicine in children. This medicine is not approved for use in children. °Overdosage: If you think you have taken too much of this medicine contact a poison control center or emergency room at once. °NOTE: This medicine is only for you. Do not share this medicine with others. °What if I miss a dose? °It is important not to miss your dose. Call your doctor or health care professional if you are unable to keep an appointment. °What may interact with this medicine? °-certain antibiotics given by injection °-NSAIDs, medicines for pain and inflammation, like ibuprofen or naproxen °-some diuretics like  bumetanide, furosemide °-teriparatide °This list may not describe all possible interactions. Give your health care provider a list of all the medicines, herbs, non-prescription drugs, or dietary supplements you use. Also tell them if you smoke, drink alcohol, or use illegal drugs. Some items may interact with your medicine. °What should I watch for while using this medicine? °Visit your doctor or health care professional for regular checkups. It may be some time before you see the benefit from this medicine. Do not stop taking your medicine unless your doctor tells you to. Your doctor may order blood tests or other tests to see how you are doing. °Women should inform their doctor if they wish to become pregnant or think they might be pregnant. There is a potential for serious side effects to an unborn child. Talk to your health care professional or pharmacist for more information. °You should make sure that you get enough calcium and vitamin D while you are taking this medicine. Discuss the foods you eat and the vitamins you take with your health care professional. °Some people who take this medicine have severe bone, joint, and/or muscle pain. This medicine may also increase your risk for jaw problems or a broken thigh bone. Tell your doctor right away if you have severe pain in your jaw, bones, joints, or muscles. Tell your doctor if you have any pain that does not go away or that gets worse. °Tell your dentist and dental surgeon that you are taking this medicine. You should not have major dental surgery while on this medicine. See your dentist to have a dental exam and fix any dental problems before starting this medicine. Take good care   of your teeth while on this medicine. Make sure you see your dentist for regular follow-up appointments. °What side effects may I notice from receiving this medicine? °Side effects that you should report to your doctor or health care professional as soon as possible: °-allergic  reactions like skin rash, itching or hives, swelling of the face, lips, or tongue °-anxiety, confusion, or depression °-breathing problems °-changes in vision °-eye pain °-feeling faint or lightheaded, falls °-jaw pain, especially after dental work °-mouth sores °-muscle cramps, stiffness, or weakness °-trouble passing urine or change in the amount of urine °Side effects that usually do not require medical attention (report to your doctor or health care professional if they continue or are bothersome): °-bone, joint, or muscle pain °-constipation °-diarrhea °-fever °-hair loss °-irritation at site where injected °-loss of appetite °-nausea, vomiting °-stomach upset °-trouble sleeping °-trouble swallowing °-weak or tired °This list may not describe all possible side effects. Call your doctor for medical advice about side effects. You may report side effects to FDA at 1-800-FDA-1088. °Where should I keep my medicine? °This drug is given in a hospital or clinic and will not be stored at home. °NOTE: This sheet is a summary. It may not cover all possible information. If you have questions about this medicine, talk to your doctor, pharmacist, or health care provider. °© 2014, [employer]. (2012-06-23 10:03:48) ° °

## 2013-06-02 ENCOUNTER — Encounter: Payer: Self-pay | Admitting: Family

## 2013-06-02 ENCOUNTER — Ambulatory Visit (INDEPENDENT_AMBULATORY_CARE_PROVIDER_SITE_OTHER): Payer: Medicare Other | Admitting: Family

## 2013-06-02 VITALS — BP 108/78 | HR 83 | Temp 98.1°F | Resp 18 | Ht 67.5 in | Wt 172.1 lb

## 2013-06-02 DIAGNOSIS — E039 Hypothyroidism, unspecified: Secondary | ICD-10-CM

## 2013-06-02 DIAGNOSIS — R739 Hyperglycemia, unspecified: Secondary | ICD-10-CM

## 2013-06-02 DIAGNOSIS — E785 Hyperlipidemia, unspecified: Secondary | ICD-10-CM

## 2013-06-02 DIAGNOSIS — R7309 Other abnormal glucose: Secondary | ICD-10-CM

## 2013-06-02 DIAGNOSIS — M549 Dorsalgia, unspecified: Secondary | ICD-10-CM

## 2013-06-02 LAB — HEMOGLOBIN A1C
Hgb A1c MFr Bld: 6.1 % — ABNORMAL HIGH (ref <5.7)
Mean Plasma Glucose: 128 mg/dL — ABNORMAL HIGH (ref <117)

## 2013-06-02 MED ORDER — MELOXICAM 7.5 MG PO TABS: 7.5000 mg | ORAL_TABLET | Freq: Every day | ORAL | Status: DC

## 2013-06-02 MED ORDER — LEVOTHYROXINE SODIUM 112 MCG PO TABS: 112.0000 ug | ORAL_TABLET | Freq: Every day | ORAL | Status: DC

## 2013-06-02 NOTE — Assessment & Plan Note (Signed)
rx provided for prn meloxicam.

## 2013-06-02 NOTE — Progress Notes (Signed)
Subjective:    Patient ID: Amanda Boyer, female    DOB: October 07, 1932, 80 y.o.   MRN: [id number]  HPI  Amanda Boyer is a 80 yr old female who presents today for follow up of multiple medical problems. She plans to move to [place] in the near future.  1) Hypothyroid- she is maintained on levothyroxine 112 mcg.  2) Hyperlipidemia- due for lipid panel.  On lipitor, LFT normal.  3) Back pain- requesting refill on meloxicam for prn use.   She brings lab work with her today which includes LFT, bmet, cbc- labs are reviewed. (from [facility]- Dr. Alvie Heidelberg)  Review of Systems See HPI  Past Medical History  Diagnosis Date  . Dyspnea on exertion   . COPD (chronic obstructive pulmonary disease)   . Hyperlipidemia   . Thyroid disease     hypothyroidism  . GERD (gastroesophageal reflux disease)   . Osteoporosis   . History of tobacco abuse   . Grave's disease   . Colon polyps     History   Social History  . Marital Status: Widowed    Spouse Name: N/A    Number of Children: 1  . Years of Education: N/A   Occupational History  . RETIRED     works part time as Development worker, community   Social History Main Topics  . Smoking status: Former Smoker -- 1.50 packs/day for 41 years    Types: Cigarettes    Quit date: 01/22/2001  . Smokeless tobacco: Never Used  . Alcohol Use: Yes     Comment: rare  . Drug Use: No  . Sexual Activity: Not on file   Other Topics Concern  . Not on file   Social History Narrative   Lives alone. Works part time as Development worker, community. Originally from [place]. Previous to living in [place], she was in [place], [place].    Past Surgical History  Procedure Laterality Date  . Appendectomy  1960  . Cholecystectomy  1960  . Tonsillectomy  1941  . Cataract extraction  2002    Family History  Problem Relation Age of Onset  . Arthritis Mother     rheumatoid  . Heart disease Brother     s/p stents  . Cancer Other     multiple  siblings with colon cancer    Allergies  Allergen Reactions  . Codeine     REACTION: passes out  . Niacin     REACTION: Sever Flushing  . Penicillins     Rash all over  . Sulfonamide Derivatives     REACTION: Nausea    Current Outpatient Prescriptions on File Prior to Visit  Medication Sig Dispense Refill  . aspirin 81 MG tablet Take 81 mg by mouth daily as needed.       Marland Kitchen atorvastatin (LIPITOR) 20 MG tablet Take 1 tablet (20 mg total) by mouth daily.  90 tablet  3  . calcium carbonate (TUMS - DOSED IN MG ELEMENTAL CALCIUM) 500 MG chewable tablet Chew 2 tablets by mouth 3 (three) times daily as needed.        . cholecalciferol (VITAMIN D) 1000 UNITS tablet Take 2,000 Units by mouth daily.       . Fluticasone-Salmeterol (ADVAIR DISKUS) 250-50 MCG/DOSE AEPB Inhale 1 puff into the lungs every 12 (twelve) hours.  180 each  4  . tiotropium (SPIRIVA) 18 MCG inhalation capsule Place 1 capsule (18 mcg total) into inhaler and inhale daily.  90 capsule  4  .  zoledronic acid (RECLAST) 5 MG/100ML SOLN Inject 100 mLs (5 mg total) into the vein once.  100 mL  0  . omeprazole (PRILOSEC) 40 MG capsule Take 40 mg by mouth daily.       No current facility-administered medications on file prior to visit.    BP 108/78  Pulse 83  Temp(Src) 98.1 F (36.7 C) (Oral)  Resp 18  Ht 5' 7.5" (1.715 m)  Wt 172 lb 1.9 oz (78.073 kg)  BMI 26.54 kg/m2  SpO2 95%       Objective:   Physical Exam  Constitutional: She is oriented to person, place, and time. She appears well-developed and well-nourished. No distress.  HENT:  Head: Normocephalic and atraumatic.  Cardiovascular: Normal rate and regular rhythm.   No murmur heard. Pulmonary/Chest: Effort normal and breath sounds normal. No respiratory distress. She has no wheezes. She has no rales. She exhibits no tenderness.  Neurological: She is alert and oriented to person, place, and time.  Psychiatric: She has a normal mood and affect. Her behavior is  normal. Judgment and thought content normal.          Assessment & Plan:

## 2013-06-02 NOTE — Progress Notes (Signed)
Pre visit review using our clinic review tool, if applicable. No additional management support is needed unless otherwise documented below in the visit note. 

## 2013-06-02 NOTE — Assessment & Plan Note (Signed)
Clinically stable. Check TSH, continue same.

## 2013-06-02 NOTE — Patient Instructions (Signed)
Please complete your lab work prior to leaving. Good luck in [place].

## 2013-06-02 NOTE — Assessment & Plan Note (Signed)
Obtain flp  

## 2013-06-03 ENCOUNTER — Other Ambulatory Visit: Payer: Self-pay | Admitting: Family

## 2013-06-03 DIAGNOSIS — E039 Hypothyroidism, unspecified: Secondary | ICD-10-CM

## 2013-06-03 DIAGNOSIS — E785 Hyperlipidemia, unspecified: Secondary | ICD-10-CM

## 2013-06-03 LAB — LIPID PANEL
CHOL/HDL RATIO: 4.8 ratio
CHOLESTEROL: 197 mg/dL (ref 0–200)
HDL: 41 mg/dL (ref >39)
LDL Cholesterol: 125 mg/dL — ABNORMAL HIGH (ref 0–99)
TRIGLYCERIDES: 157 mg/dL — AB (ref <150)
VLDL: 31 mg/dL (ref 0–40)

## 2013-06-03 LAB — TSH: TSH: 0.335 u[IU]/mL — AB (ref 0.350–4.500)

## 2013-06-03 MED ORDER — LEVOTHYROXINE SODIUM 100 MCG PO TABS: 100.0000 ug | ORAL_TABLET | Freq: Every day | ORAL | Status: DC

## 2013-06-03 NOTE — Telephone Encounter (Signed)
Pt left message returning my call. Attempted to reach pt and left detailed message on cell# re: below instructions and to call and confirm if she would like to pick up copy of labs and instructions for new Provider when she relocates or will she be here to complete follow up labs.

## 2013-06-03 NOTE — Telephone Encounter (Signed)
Left message on cell# to return my call. 

## 2013-06-03 NOTE — Telephone Encounter (Signed)
Please contact pt and let her know that her lab work shows synthroid should be decreased to 17mcg. She should discard of paper Rx I gave her yesterday. Repeat TSH 4-6 weeks either here or with a new physician in [place]. Sugar remains mildly elevated.  LDL above goal. Please confirm that she is taking atorvastatin.  If so, She should increase dose to 40mg  and repeat flp/lft in 4-6 weeks as well, dx hyperlipidemia.

## 2013-06-05 MED ORDER — ATORVASTATIN CALCIUM 40 MG PO TABS: 40.0000 mg | ORAL_TABLET | Freq: Every day | ORAL | Status: DC

## 2013-06-05 NOTE — Telephone Encounter (Signed)
Spoke with pt and notified her of below results. She states she has been taking atorvastatin 20mg  daily (no missed doses). Pt requests Rx of 40mg  be faxed to [facility] at [phone] with SSN# [id number]. Pt states she will still be here in 6 weeks. Lab order entered.

## 2013-07-16 LAB — HEPATIC FUNCTION PANEL
ALT: 16 U/L (ref 0–35)
AST: 17 U/L (ref 0–37)
Albumin: 4.2 g/dL (ref 3.5–5.2)
Alkaline Phosphatase: 57 U/L (ref 39–117)
BILIRUBIN DIRECT: 0.1 mg/dL (ref 0.0–0.3)
Indirect Bilirubin: 0.5 mg/dL (ref 0.2–1.2)
TOTAL PROTEIN: 6.7 g/dL (ref 6.0–8.3)
Total Bilirubin: 0.6 mg/dL (ref 0.2–1.2)

## 2013-07-16 LAB — LIPID PANEL
CHOL/HDL RATIO: 4.3 ratio
CHOLESTEROL: 180 mg/dL (ref 0–200)
HDL: 42 mg/dL (ref >39)
LDL Cholesterol: 103 mg/dL — ABNORMAL HIGH (ref 0–99)
TRIGLYCERIDES: 177 mg/dL — AB (ref <150)
VLDL: 35 mg/dL (ref 0–40)

## 2013-07-17 ENCOUNTER — Encounter: Payer: Self-pay | Admitting: Family

## 2013-07-17 LAB — TSH: TSH: 1.272 u[IU]/mL (ref 0.350–4.500)

## 2013-08-05 ENCOUNTER — Telehealth: Payer: Self-pay | Admitting: *Deleted

## 2013-08-05 ENCOUNTER — Other Ambulatory Visit: Payer: Self-pay | Admitting: Family

## 2013-08-05 MED ORDER — ATORVASTATIN CALCIUM 40 MG PO TABS: 40.0000 mg | ORAL_TABLET | Freq: Every day | ORAL | Status: DC

## 2013-08-05 NOTE — Telephone Encounter (Signed)
Received call from pt requesting written Rx of lipitor for 90 day supply x 2 refills to take with her when she moves to [place] in November. Pt wants to pick up Rx this afternoon and will come by to get it when she picks up thyroid Rx from the pharmacy. Rx printed and forwarded to Provider for signature. Pt also requests copies of lumbar xray from 11/2012. Notified Linda in radiology and pt will pick them up this afternoon.

## 2013-08-05 NOTE — Telephone Encounter (Signed)
Rx was completed and forwarded to front desk for pick up.

## 2013-08-05 NOTE — Telephone Encounter (Signed)
Received fax from [facility]  Patient is requesting a 90 day supply

## 2013-08-05 NOTE — Telephone Encounter (Deleted)
Received call from pt requesting written Rx of lipitor for 90 day supply x 2 refills to take with her when she moves to [place] in November. Pt wants to pick up Rx this afternoon and will come by to get it when she picks up thyroid Rx from the pharmacy. Rx printed and forwarded to Provider for signature. Pt also requests copies of lumbar xray from 11/2012. Notified Linda in radiology and pt will pick them up this afternoon.

## 2013-08-05 NOTE — Telephone Encounter (Signed)
Rx request to pharmacy/[facility]  

## 2013-08-07 MED ORDER — LEVOTHYROXINE SODIUM 100 MCG PO TABS: ORAL_TABLET | ORAL | Status: DC

## 2013-08-07 NOTE — Addendum Note (Signed)
Addended by: Kelle Darting A on: 08/07/2013 12:16 PM   Modules accepted: Orders

## 2013-08-07 NOTE — Telephone Encounter (Signed)
Received fax from pharmacy requesting 90 day supply of levothyroxine. Rx re-sent, #90 x 2 refills.

## 2013-10-01 ENCOUNTER — Ambulatory Visit (INDEPENDENT_AMBULATORY_CARE_PROVIDER_SITE_OTHER): Payer: Medicare Other | Admitting: Pulmonary Disease

## 2013-10-01 ENCOUNTER — Encounter: Payer: Self-pay | Admitting: Pulmonary Disease

## 2013-10-01 VITALS — BP 128/80 | HR 70 | Temp 98.0°F | Ht 68.0 in | Wt 171.8 lb

## 2013-10-01 DIAGNOSIS — E039 Hypothyroidism, unspecified: Secondary | ICD-10-CM

## 2013-10-01 DIAGNOSIS — R911 Solitary pulmonary nodule: Secondary | ICD-10-CM

## 2013-10-01 DIAGNOSIS — J449 Chronic obstructive pulmonary disease, unspecified: Secondary | ICD-10-CM

## 2013-10-01 DIAGNOSIS — E785 Hyperlipidemia, unspecified: Secondary | ICD-10-CM

## 2013-10-01 DIAGNOSIS — M810 Age-related osteoporosis without current pathological fracture: Secondary | ICD-10-CM

## 2013-10-01 DIAGNOSIS — J4489 Other specified chronic obstructive pulmonary disease: Secondary | ICD-10-CM

## 2013-10-01 DIAGNOSIS — D126 Benign neoplasm of colon, unspecified: Secondary | ICD-10-CM

## 2013-10-01 DIAGNOSIS — K219 Gastro-esophageal reflux disease without esophagitis: Secondary | ICD-10-CM

## 2013-10-01 DIAGNOSIS — Z23 Encounter for immunization: Secondary | ICD-10-CM

## 2013-10-01 MED ORDER — ATORVASTATIN CALCIUM 40 MG PO TABS: 40.0000 mg | ORAL_TABLET | Freq: Every day | ORAL | Status: AC

## 2013-10-01 MED ORDER — LEVOTHYROXINE SODIUM 100 MCG PO TABS: ORAL_TABLET | ORAL | Status: AC

## 2013-10-01 MED ORDER — FLUTICASONE-SALMETEROL 250-50 MCG/DOSE IN AEPB: 1.0000 | INHALATION_SPRAY | Freq: Two times a day (BID) | RESPIRATORY_TRACT | Status: AC

## 2013-10-01 MED ORDER — TIOTROPIUM BROMIDE MONOHYDRATE 18 MCG IN CAPS: 18.0000 ug | ORAL_CAPSULE | Freq: Every day | RESPIRATORY_TRACT | Status: AC

## 2013-10-01 NOTE — Addendum Note (Signed)
Addended by: Elie Confer on: 10/01/2013 04:55 PM   Modules accepted: Orders

## 2013-10-01 NOTE — Progress Notes (Addendum)
Subjective:    Patient ID: TARAOLUWA THAKUR, female    DOB: 11-Feb-1932, 80 y.o.   MRN: [id number]  HPI 80 y/o WF, who used to work for [employer] in the 90's, self referred 10/11 for complaints of DOE... she is an ex-smoker, no prev COPD diagnosis, no hx recurrent infections, etc... she has hx Hyperlipidemia, hx Grave's dis- s/p I-131 Rx w/ subseq hypothyroidism on Synthroid, hx GERD, hx colon polyps w/ FamHx of Lynch syndrome, and Osteoporosis (SEE BELOW)> followed by DrYoo/ Debbrah Alar for primary care...  ~  November 10, 2009:  presents w/ CC of DOE x 101yr- noted w/ yard work, housework, etc... gradual onset & not really progressive by her hx but appt request precipitated by episode of SOB several weeks ago while in her car... she notes SOB= hard to get the air out, denies any cough or phlegm, no hemoptysis, no wheezing heard, no CP or palpit, no change in swelling etc... she has not had any recent infection... she is an ex-smoker having started in her 61's & smoked up to 2ppd for 40 yrs, quitting in 2003 at age 4 (she quit then because of SOB)... her last CXR was 9/06 and showed tort Ao, clear lungs, NAD... she has not had prev PFTs ==> eval included CXR (hyperinflation, attenuated markings, clear/ NAD); and PFTs (mod airflow obstruction w/ FEV1= 1.10liters); started on Advair & Spiriva.  ~  January 03, 2010:  she notes considerable improvement in her breathing & denies dyspnea at present... she feels the Advair/ Spiriva has really helped & she can walk farther, work longer, etc> all w/o DOE now... still denies CP, palpit, cough, phlegm, edema, etc... notes intermittent throat irritation & she is reminded to rinse after each Rx & we gave her a perscription for MMW for Prn use... we will hold off on repeat spirometry today... we discussed her immuniz status> she gets the yearly Flu vaccine (had it 10/11);  had PNEUMOVAX in 2001 at age 76, so she needs another post-65 vaccination & we gave that to  her today;  had Tetanus shot in 2002 in [place] ER after cutting her foot on glass...  she just ret from trip to [place], and is heading out to [facility] to see her brother.  ~  August 07, 2010:  6mo ROV & she has had several trips visiting relatives & did quite well on her current meds, denies cough, sputum, SOB, chest discomfort, etc; needs refills for 90d supplies... She was concerned about not getting her Reclast infusion yet this yr as Darlin Priestly is out on leave & we volunteered to set this up for her but she declined preferring to wait until Sep12 after all her trips are done... She is due for a 15yr Tetanus vaccination & we gave her the TDAP today...  ~  January 29, 2011:  34mo ROV & pulmonary follow up> she is an ex-smoker quit 11/03, states her breathing is good, notes sl SOB w/ the wet, cold weather;  She is on Advair & Spiriva & takes them regularly, tolerated well;  Shew denies cough, sputum, SOB (notes DOE w/o change), CP, & notes improved stamina etc...  CXR remains clear, NAD; and PFT shows a fabulous improvement in her FEV1...  ~  July 30, 2011:  27mo ROV & Fraser Din has been doing well, no new complaints or concerns;  She continues to follow w/ Debbrah Alar at the [facility] office for Primary Care & had a Medicare Physical 1/13... Her breathing remains  good on Advair & Spiriva...    We reviewed prob list, meds, xrays and labs> see below for updates>>  ~  March 04, 2012:  34mo ROV & Pat tells me that she was ill about 6mo ago when visiting relatives in [place] w/ COPD exac from RSV viral infection;  She developed what she thought was the flu (she had the Flu vax from her primary doctor last fall) w/ dry cough, wheezing, SOB, & weakness to the point that she couldn't walk;  Taken to the ER at [facility] in [place] & saw Pulmonologist DrMagcalas;  She was hosp for 5d & disch on our same meds + Levaquin, Prednisone, & VentolinHFA prn;  She brought some records home w/ her & CXR- clear, no pneumonia; Viral  panel was neg x RSV pos...  She is now off the Levaquin & Pred- feeling back to baseline & ready to resume her travels...     We reviewed prob list, meds, xrays and labs> see below for updates >> she is followed for primary care by Debbrah Alar at the [place] office...  ~  September 30, 2012:  6mo ROV & Pat reports a good interval, no recurrent resp infections or breathing problems; she quit smoking >10 yrs ago, takes her Advair & Spiriva regularly, she is "able to run rings around" her younger relative & remains very active... Fraser Din tells me that she will be moving back to [place] when she is able to sell her [place] home... We reviewed prob list, meds, xrays and labs> see below for updates >> OK 2014 Flu shot today...  ~  March 31, 2013:  13mo ROV & pulmonary follow up> Fraser Din reports that she is in the process of selling her condo & planning to move back to [place];  She has a dry cough, head congested, denies CP or SOB;  Exam shows stable VS/ afeb, decr breath sounds at bases w/ scat rhonchi but no consolidation;  She remains on Advair250Bid, spiriva daily, and uses an AlbuterolHFA inhaler as needed;  We decided to check CXR (COPD, clear, NAD) and PFT (fair effort- looks mildly restricted), and treat w/ Medrol dosepak, Mucinex, Delsym...  CXR 3/15 showed norm heart size, underlying COPD, clear lungs x some scarring at bases, NAD...  PFT 3/15 (fair effort) showed FVC=2.17 (68%), FEV1=1.60 (69%), %1sec=74, mid-flows= 67% predicted...   ~  October 01, 2013:  36mo ROV & Fraser Din tells me that she has sold her condo in [place], & will be moving to [place] area in December;  She would like to get Flu shot today and med refills to last until she can get established in [place];  She had GI f/u DrJue in [facility] 6/15 w/ EGD (stricture dilated) and Colonoscopy (5 polyps removed)... We reviewed prob list, meds, xrays and labs> see below for updates >>   CT Chest 9/15 showed norm heart size & Ao; lungs  w/ some scarring & sm 29mm RML nodule, no adenopathy;  Rec- f/u CXR in 26mo & repeat CT Chest in 56yr...           Problem List:    SHE IS FOLLOWED AT [facility] [place] OFFICE FOR PRIMARY CARE...  CHRONIC OBSTRUCTIVE PULMONARY DISEASE (ICD-496), DYSPNEA ON EXERTION (ICD-786.09), Hx of TOBACCO ABUSE (ICD-305.1) >> on ADVAIR 250- 2spBid & SPIRIVA daily. ~  ex-smoker> started in her 80's & smoked up to 2ppd for 40 yrs, quitting in 2003 at age 52. ~  CXR 10/11  showed COPD, tortuous Ao, NAD... ~  PFTs 10/11 showed FVC= 1.70 (51%), FEV1= 1.10 (44%), %1sec= 65, mid-flows= 32%pred... c/w GOLD Stage3 COPD ~  7/12:  she reports improved breathing since starting Advair & Spiriva 10/11... ~  CXR 1/13 showed COPD, clear lungs, NAD... ~  PFT 1/13 showed FVC=2.41 (74%), FEV1=1.70 (71%), %1sec=71, mid-flows=53%predicted... Nice improvement from her baseline on Advair & Spiriva. ~  1/14:  She was [facility] in [place] x5d w/ a COPD exac caused by RSV virus found on viral panel=> full recovery back to baseline; CXR report 1/14 showed hyperinflation, norm heart size, clear lungs, NAD. ~  9/14:  She has been doing well on Adviar250-Bid & Spiriva daily; min cough (esp w/ laughing), no CP/ palpit/ dizzy etc; she has chr stable DOE w/o change & she is very active; exam w/ decr BS but clear w/o w/r/r... ~  3/15: stable on Advair250 & Spiriva as noted; she developed URI w/ dry cough & congestion=> treated w/ Medrol Dosepak, Mucinex, Delsym... ~  CXR 3/15 showed norm heart size, uncoiled Ao, underlying COPD, clear lungs x some scarring at bases, NAD... ~  PFT 3/15 (fair effort) showed FVC=2.17 (68%), FEV1=1.60 (69%), %1sec=74, mid-flows= 67% predicted...  ~  9/15: on Advair250Bid, Spiriva daily, VentolinHFA prn; breathing is stable- min cough, no sput/ hemoptysis/ CP/ SOB... ~  CT Chest 9/15 showed norm heart size & Ao; lungs w/ some scarring & sm 34mm RML nodule, no adenopathy;  Rec- f/u CXR in 96mo & repeat CT Chest in 14yr.    Hx of  CARDIAC EVAL in 2005 by DrWall- borderline MVP> ~  Treadmill test 7/05 by DrWall did not reveal any ischemia, but there was an exaggerated BP response (up to 200/84 in stage I). ~  Myoview 8/05 showed mild breast attenuation, no ischemia or infarct, EF=65%... ~  2DEcho 9/06 showed mild MR w/ borderline prolapse, otherw normal... ~  Art Dopplers 10/07 showed normal ABIs & waveforms... ~  She denies CP, palpit, dizzy, syncope, etc...  HYPERLIPIDEMIA (ICD-272.4) - on LIPITOR $RemoveBe'20mg'OzNlANvIO$ /d=> incr to LIPITOR40 in 2015. ~  FLP 1/13 on Lip20 showed TChol 189, TG 142, HDL 49, LDL 112 ~  FLP 11/13 on Lip20 showed TChol 198, TG 118, HDL 47, LDL 127... ~  FLP 5/15 on Lip20 showed TChol 197, TG 157, HDL 41, LDL 125... Primary Care increased her to $Remo'40mg'RjTup$ /d. ~  FLP 6/15 on Lip40 showed TChol 180, TG 177, HDL 42, LDL 103... Advised on better low fat diet & take meds everyday.  GRAVE'S DISEASE (ICD-242.00) - treated w/ I-131 in 2004... HYPOTHYROIDISM (ICD-244.9) - on SYNTHROID in varying doses per primary... ?if pt is taking 1/2 tab?  ~  TSH values in 2011 and 2012 ranged from 0.54 to 3.39 all supposedly on 133mcg/d (but ?compliance?)... ~  Labs 12/12 showed TSH= 3.39 & Debbrah Alar increased her Synthroid from $RemoveBefore'100mg'vxjTqruujvmQL$ /d to 150mcg/d... ~  Labs 1/13 on Synthroid112 showed TSH= 2.96 ~  Labs 11/13 on Synthroid112 showed TSH= 2.11 ~  Labs 5/14 on Synthroid112 showed TSH= 1.13 ~  Labs 5/15 on Synthroid112 showed TSH= 0.335 and Primary Care decreased her to 141mcg/d. ~  Labs 6/15 on Synthroid100 showed TSH= 1.27  GASTROESOPHAGEAL REFLUX DISEASE (ICD-530.81) - prev on ZANTAC $RemoveB'150mg'fErYaCmx$  Bid Prn for reflux symptoms=> changed to PRILOSEC20. ~  EGD by [facility] 8/10 showed a Schatzki ring, mod HH, otherw neg... ~  She reports EGD by DrJue in [place] showed stricture=> dilated...  +FamHx Lynch syndrome w/ +genetic markers in  Mother, Brother, Sister- all of whom have had colon cancers; Pat's genetic markers were neg at  [facility] COLONIC POLYPS (ICD-211.3) - prev followed by [facility] for GI> she had hx of ischemic colitis in past... ~  Colonoscopy 8/10 showed scattered divertics, 1 diminutive polyp= hyperplastic. ~  She reports that Colonoscopy by DrJue in [place] showed 5 polyps (we do not have path); f/u rec in 5 yrs...  DYSFUNCTION of the SPHINCTER OF ODI >> she is now followed for GI by DrJue out of [place] in the [place] office. ~  MRI Abd 9/10 showed mild biliary dil- related to prev cholecystectomy, pancreas divisum, otherw neg. ~  Abd sonar 3/15 at [facility] showed fatty liver, s/p cholecystectomy, stable biliary tract dilatation, atherosclerosis w/o AAA...  ~  7/13:  She tells me that DrJue diagnosed dysfunction in the sphincter of Odi as an explanation for her RUQ abd discomfort; she says he did not rec surg & just to deal w/ the pain offering her pain meds or pain consult if it got worse... ~  She takes Levsin 0.125mg  Bid for control of symptoms...  OSTEOPOROSIS (ICD-733.00) - on RECLAST infusions yearly, along w/ Calcium & VitD supplements daily...  R/O GENETIC SUSCEPTIBILITY OTHER MALIGNANT NEOPLASM (ICD-V84.09) - there is a family hx of [facility] & she has been evaluated at the [facility]> she was negative for the MLH1 mutation previously identified in members of her family...   Past Surgical History  Procedure Laterality Date  . Appendectomy  1960  . Cholecystectomy  1960  . Tonsillectomy  1941  . Cataract extraction  2002    Outpatient Encounter Prescriptions as of 10/01/2013  Medication Sig  . atorvastatin (LIPITOR) 40 MG tablet Take 1 tablet (40 mg total) by mouth daily.  . calcium carbonate (TUMS - DOSED IN MG ELEMENTAL CALCIUM) 500 MG chewable tablet Chew 2 tablets by mouth 3 (three) times daily as needed.    . cholecalciferol (VITAMIN D) 1000 UNITS tablet Take 2,000 Units by mouth daily.   . Fluticasone-Salmeterol (ADVAIR DISKUS) 250-50 MCG/DOSE AEPB Inhale 1 puff into  the lungs every 12 (twelve) hours.  . hyoscyamine (LEVSIN, ANASPAZ) 0.125 MG tablet Take 0.125 mg by mouth every 4 (four) hours as needed.  Marland Kitchen levothyroxine (SYNTHROID, LEVOTHROID) 100 MCG tablet TAKE 1 TABLET (100 MCG) BY MOUTH DAILY.  Marland Kitchen omeprazole (PRILOSEC) 40 MG capsule Take 40 mg by mouth daily.  Marland Kitchen tiotropium (SPIRIVA) 18 MCG inhalation capsule Place 1 capsule (18 mcg total) into inhaler and inhale daily.  . zoledronic acid (RECLAST) 5 MG/100ML SOLN Inject 100 mLs (5 mg total) into the vein once.  . [DISCONTINUED] atorvastatin (LIPITOR) 40 MG tablet Take 1 tablet (40 mg total) by mouth daily.  . [DISCONTINUED] Fluticasone-Salmeterol (ADVAIR DISKUS) 250-50 MCG/DOSE AEPB Inhale 1 puff into the lungs every 12 (twelve) hours.  . [DISCONTINUED] levothyroxine (SYNTHROID, LEVOTHROID) 100 MCG tablet TAKE 1 TABLET (100 MCG) BY MOUTH DAILY.  . [DISCONTINUED] tiotropium (SPIRIVA) 18 MCG inhalation capsule Place 1 capsule (18 mcg total) into inhaler and inhale daily.  . [DISCONTINUED] aspirin 81 MG tablet Take 81 mg by mouth daily as needed.   . [DISCONTINUED] meloxicam (MOBIC) 7.5 MG tablet Take 1 tablet (7.5 mg total) by mouth daily.    Allergies  Allergen Reactions  . Codeine     REACTION: passes out  . Niacin     REACTION: Sever Flushing  . Penicillins     Rash all over  . Sulfonamide Derivatives  REACTION: Nausea    Current Medications, Allergies, Past Medical History, Past Surgical History, Family History, and Social History were reviewed in [employer] record.    Review of Systems        See HPI - all other systems neg except as noted...   The patient complains of dyspnea on exertion.  The patient denies anorexia, fever, weight loss, weight gain, vision loss, decreased hearing, hoarseness, chest pain, syncope, peripheral edema, prolonged cough, headaches, hemoptysis, abdominal pain, melena, hematochezia, severe indigestion/heartburn, hematuria, incontinence,  muscle weakness, suspicious skin lesions, transient blindness, difficulty walking, depression, unusual weight change, abnormal bleeding, enlarged lymph nodes, and angioedema.     Objective:   Physical Exam     WD, WN, 80 y/o WF in NAD... GENERAL:  Alert & oriented; pleasant & cooperative... HEENT:  [place]/AT, EOM-wnl, PERRLA, EACs-clear, TMs-wnl, NOSE-clear, THROAT-clear & wnl. NECK:  Supple w/ fairROM; no JVD; normal carotid impulses w/o bruits; no thyromegaly or nodules palpated; no lymphadenopathy. CHEST:  Clear to P & A w/ decr BS bilat- without wheezes/ rales/ or rhonchi heard... HEART:  Regular Rhythm; without murmurs/ rubs/ or gallops detected... ABDOMEN:  Soft & nontender; normal bowel sounds; no organomegaly or masses palpated... EXT: without deformities or arthritic changes; no varicose veins/ venous insuffic/ or edema. NEURO:  CN's intact; motor testing normal; sensory testing normal; gait normal & balance OK. DERM:  No lesions noted; no rash etc...  RADIOLOGY DATA:  Reviewed in the EPIC EMR & discussed w/ the patient...  LABORATORY DATA:  Reviewed in the EPIC EMR & discussed w/ the patient...   Assessment & Plan:    COPD>  Stable on regular dosing of her ADVAIR 250 Bid & SPIRIVA daily; she has VentolinHFA for prn use... We reviewed her old CXRs and decided to check CT Chest w/ contrast for closer examination=> some scarring & tiny 98mm RML nodule that will need f/u w/ CXR in 59mo & CT in 4yr...  Borderline MVP/ MR>  Prev 2DEcho 2006 & eval DrWall; she is essent asymptomatic, doing well...  Hyperlipidemia>  On Lipitor $RemoveBe'40mg'JGfuTOngJ$ /d now & labs followed by Primary Care...  Hx Grave's Dis/ S/P I-131 Rx/ subseq Hypothyroidism>  Synthroid112 was cut slightly to 143mcg/d in May...  GERD>  On Prilosec $RemoveBef'20mg'WqWQmRGqiJ$ /d per GI- DrJue...  Colon Polyps>  Colonoscopy by [facility] 6/15 showed several polyps (we do not have path) she was told to recheck in 8yrs...  Osteoporosis>  On Reclast infusion yearly  from her primary physician..  Other medical issues as noted...   Patient's Medications  New Prescriptions   No medications on file  Previous Medications   CALCIUM CARBONATE (TUMS - DOSED IN MG ELEMENTAL CALCIUM) 500 MG CHEWABLE TABLET    Chew 2 tablets by mouth 3 (three) times daily as needed.     CHOLECALCIFEROL (VITAMIN D) 1000 UNITS TABLET    Take 2,000 Units by mouth daily.    HYOSCYAMINE (LEVSIN, ANASPAZ) 0.125 MG TABLET    Take 0.125 mg by mouth every 4 (four) hours as needed.   OMEPRAZOLE (PRILOSEC) 40 MG CAPSULE    Take 40 mg by mouth daily.   ZOLEDRONIC ACID (RECLAST) 5 MG/100ML SOLN    Inject 100 mLs (5 mg total) into the vein once.  Modified Medications   Modified Medication Previous Medication   ATORVASTATIN (LIPITOR) 40 MG TABLET atorvastatin (LIPITOR) 40 MG tablet      Take 1 tablet (40 mg total) by mouth daily.    Take 1 tablet (  40 mg total) by mouth daily.   FLUTICASONE-SALMETEROL (ADVAIR DISKUS) 250-50 MCG/DOSE AEPB Fluticasone-Salmeterol (ADVAIR DISKUS) 250-50 MCG/DOSE AEPB      Inhale 1 puff into the lungs every 12 (twelve) hours.    Inhale 1 puff into the lungs every 12 (twelve) hours.   LEVOTHYROXINE (SYNTHROID, LEVOTHROID) 100 MCG TABLET levothyroxine (SYNTHROID, LEVOTHROID) 100 MCG tablet      TAKE 1 TABLET (100 MCG) BY MOUTH DAILY.    TAKE 1 TABLET (100 MCG) BY MOUTH DAILY.   TIOTROPIUM (SPIRIVA) 18 MCG INHALATION CAPSULE tiotropium (SPIRIVA) 18 MCG inhalation capsule      Place 1 capsule (18 mcg total) into inhaler and inhale daily.    Place 1 capsule (18 mcg total) into inhaler and inhale daily.  Discontinued Medications   ASPIRIN 81 MG TABLET    Take 81 mg by mouth daily as needed.    MELOXICAM (MOBIC) 7.5 MG TABLET    Take 1 tablet (7.5 mg total) by mouth daily.

## 2013-10-01 NOTE — Patient Instructions (Signed)
Today we updated your med list in our EPIC system...    Continue your current medications the same...  We refilled your meds per request...  We gave you the 2015 Flu vaccine today...  Call for any questions...  Best wishes on your move to [place]!!!

## 2013-10-06 ENCOUNTER — Other Ambulatory Visit (INDEPENDENT_AMBULATORY_CARE_PROVIDER_SITE_OTHER): Payer: Medicare Other

## 2013-10-06 DIAGNOSIS — J449 Chronic obstructive pulmonary disease, unspecified: Secondary | ICD-10-CM

## 2013-10-06 LAB — BASIC METABOLIC PANEL
BUN: 22 mg/dL (ref 6–23)
CO2: 26 mEq/L (ref 19–32)
Calcium: 9.1 mg/dL (ref 8.4–10.5)
Chloride: 104 mEq/L (ref 96–112)
Creatinine, Ser: 1.1 mg/dL (ref 0.4–1.2)
GFR: 53.71 mL/min — AB (ref >60.00)
Glucose, Bld: 90 mg/dL (ref 70–99)
Potassium: 4.2 mEq/L (ref 3.5–5.1)
SODIUM: 138 meq/L (ref 135–145)

## 2013-10-08 ENCOUNTER — Ambulatory Visit (INDEPENDENT_AMBULATORY_CARE_PROVIDER_SITE_OTHER)
Admission: RE | Admit: 2013-10-08 | Discharge: 2013-10-08 | Disposition: A | Discharge status: Home / Self Care | Payer: Medicare Other | Source: Ambulatory Visit | Attending: Pulmonary Disease | Admitting: Pulmonary Disease

## 2013-10-08 DIAGNOSIS — R911 Solitary pulmonary nodule: Secondary | ICD-10-CM

## 2013-10-08 MED ORDER — IOHEXOL 300 MG/ML  SOLN
80.0000 mL | Freq: Once | INTRAMUSCULAR | Status: AC | PRN
  Administered 2013-10-08: 80 mL via INTRAVENOUS

## 2015-06-04 IMAGING — CT CT CHEST W/ CM
2 of 4 series · 15 of 36 positions shown, 18 images · IV contrast (Omnipaque 300)
Comparison: Chest radiograph 03/31/2013

CLINICAL DATA: Follow-up lung nodule.  History of COPD.

EXAM:
CT CHEST WITH CONTRAST
TECHNIQUE: Multidetector CT imaging of the chest was performed during
intravenous contrast administration.
CONTRAST:  80mL OMNIPAQUE IOHEXOL 300 MG/ML  SOLN

[Series 2: chest routine with · axial · 0.64mm/px · z∈[-273,-48]mm · 12 of 55 slices shown, 15 images]
[im 5/55  mediastinal]
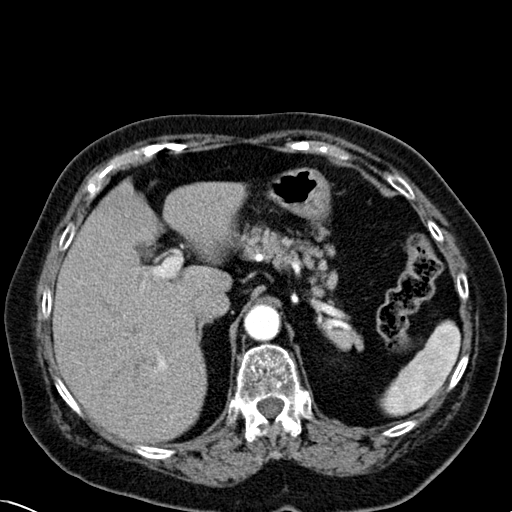
[im 5/55  lung]
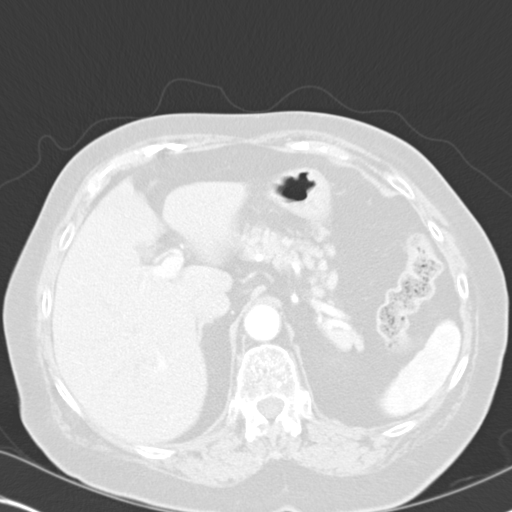
[im 9/55  lung]
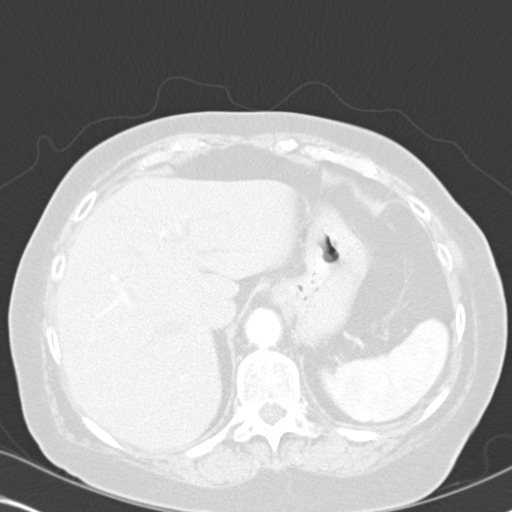
[im 13/55  lung]
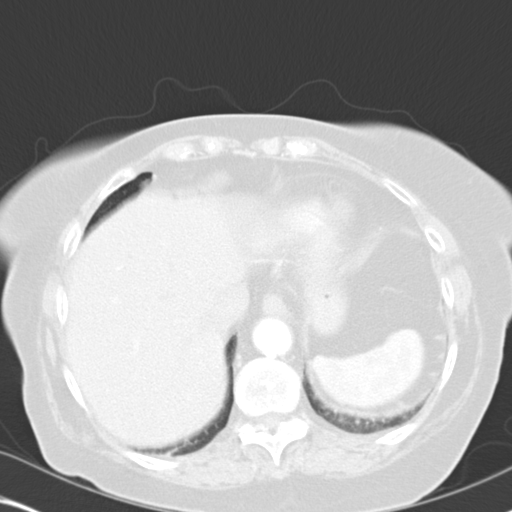
[im 17/55  lung]
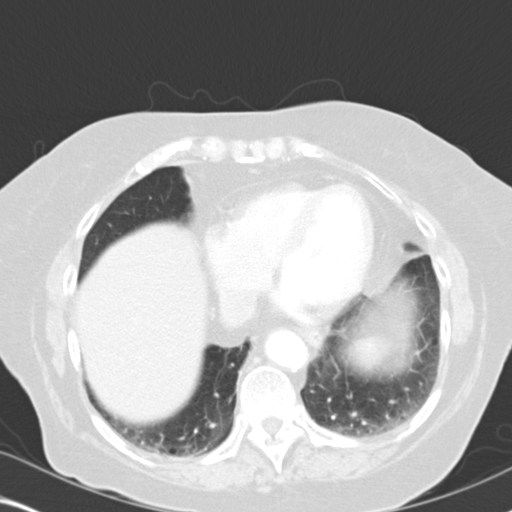
[im 21/55  mediastinal]
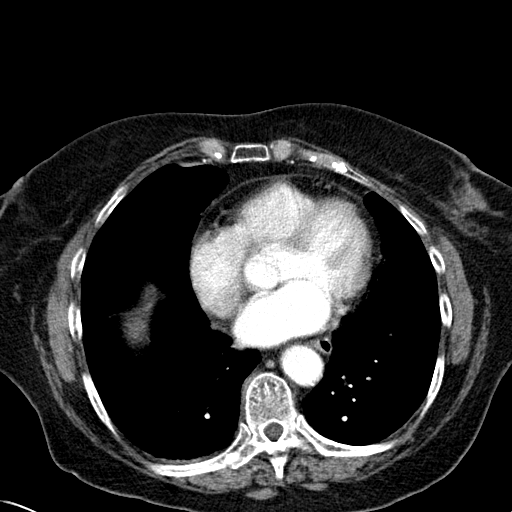
[im 21/55  lung]
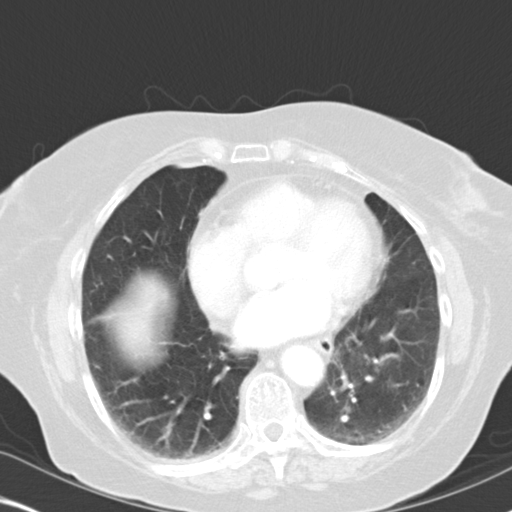
[im 25/55  lung]
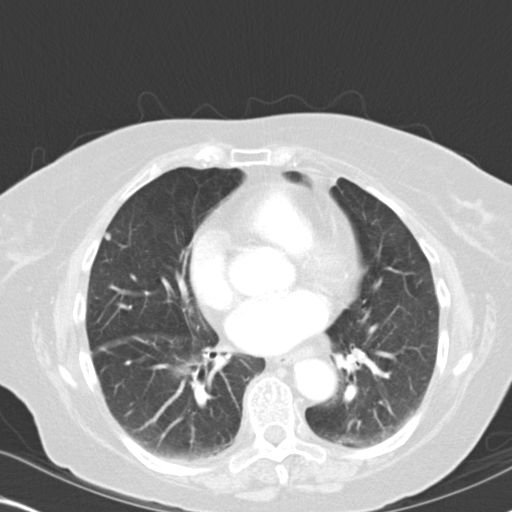
[im 30/55  lung]
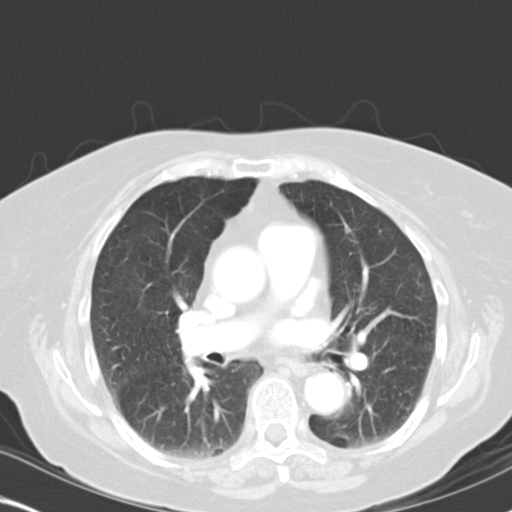
[im 34/55  lung]
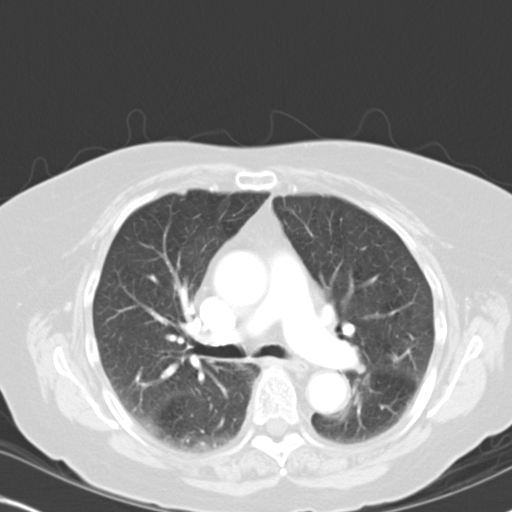
[im 38/55  mediastinal]
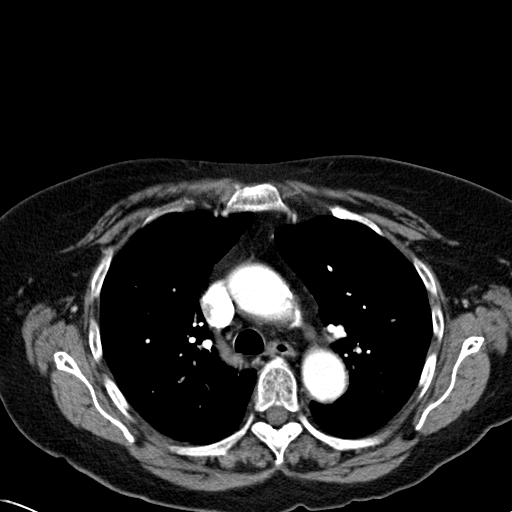
[im 38/55  lung]
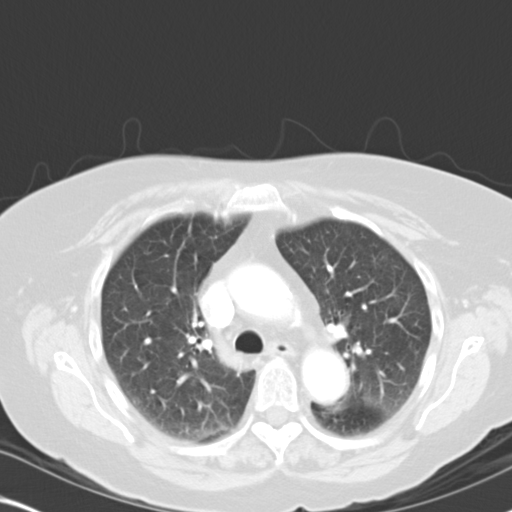
[im 42/55  lung]
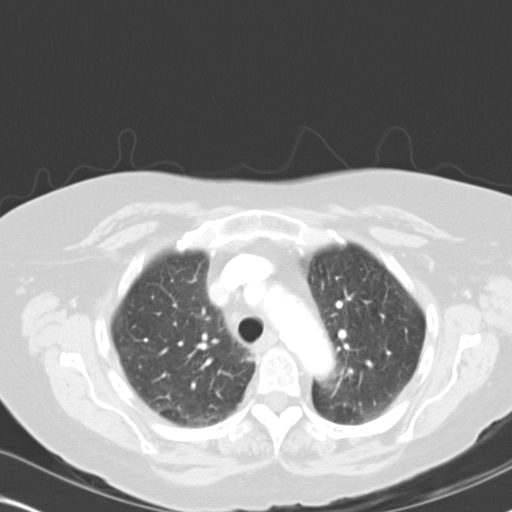
[im 46/55  lung]
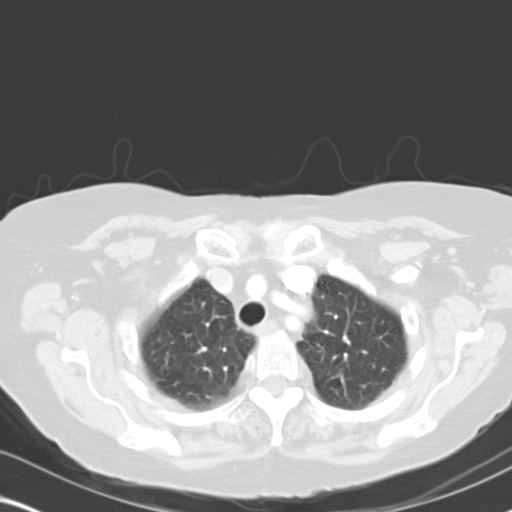
[im 50/55  lung]
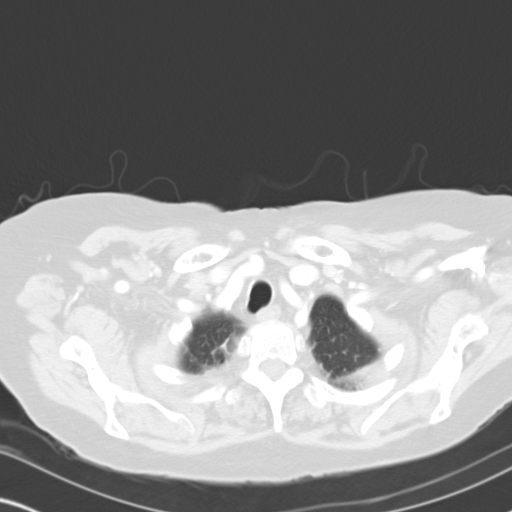

[Series 602: cor · coronal · 0.64mm/px · 3 of 108 slices shown]
[im 22/108  lung]
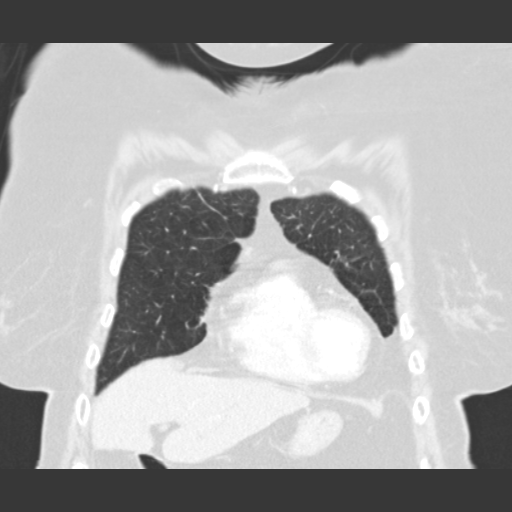
[im 43/108  lung]
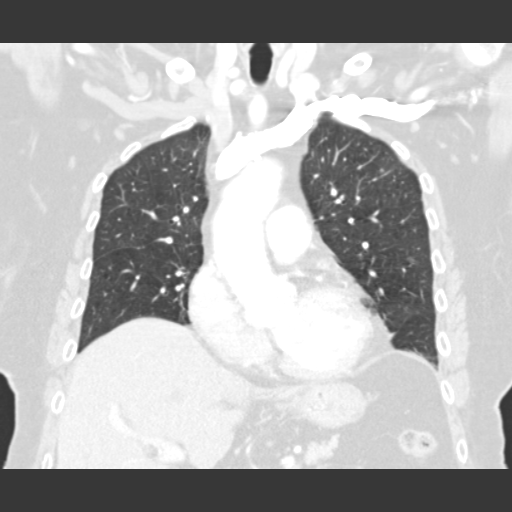
[im 65/108  lung]
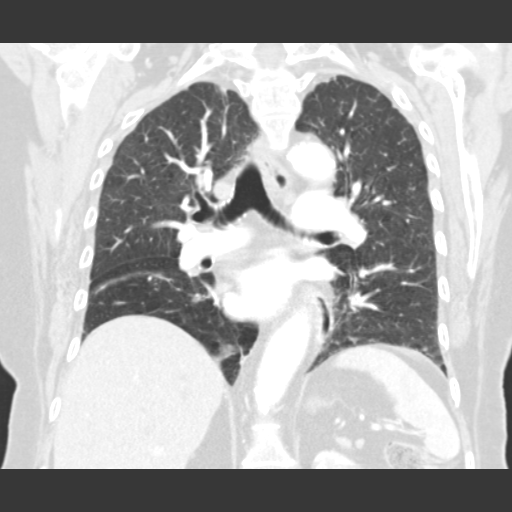

[15 of 36 positions shown; findings below may reference images not displayed]

FINDINGS: No enlarged axillary, mediastinal or hilar lymphadenopathy. Normal
heart size. Trace fluid in the superior pericardial recess. Normal
caliber aorta and main pulmonary artery.

Central airways are patent. There is a 5 mm right middle lobe
pulmonary nodule (image 31; series 3). Dependent ground-glass
opacities may represent atelectasis. No pleural effusion or
pneumothorax. Biapical pleural parenchymal scarring.

Visualization of the upper abdomen demonstrates normal adrenal
glands. No aggressive or acute appearing osseous lesions.
IMPRESSION: There is a 5 mm nodule within the right middle lobe. If the patient
is at high risk for bronchogenic carcinoma, follow-up chest CT at
6-12 months is recommended. If the patient is at low risk for
bronchogenic carcinoma, follow-up chest CT at 12 months is
recommended. This recommendation follows the consensus statement:
Guidelines for Management of Small Pulmonary Nodules Detected on CT
Scans: A Statement from the [HOSPITAL] as published in

## 2015-12-26 ENCOUNTER — Telehealth: Payer: Self-pay | Admitting: *Deleted

## 2015-12-26 NOTE — Telephone Encounter (Signed)
No answer. No VM option.

## 2016-02-14 ENCOUNTER — Telehealth: Payer: Self-pay | Admitting: Family

## 2016-02-14 NOTE — Telephone Encounter (Signed)
Patient relocated to [place] and has established with a new PCP therefore declined  medicare wellness appointment.
